# Patient Record
Sex: Female | Born: 1970 | Race: White | Hispanic: No | Marital: Married | Smoking: Never smoker
Health system: Southern US, Community
[De-identification: ages and names within clinical notes are randomized; demographics above are authoritative.]

## PROBLEM LIST (undated history)

## (undated) DIAGNOSIS — M1990 Unspecified osteoarthritis, unspecified site: Secondary | ICD-10-CM

## (undated) HISTORY — DX: Unspecified osteoarthritis, unspecified site: M19.90

---

## 2004-05-18 ENCOUNTER — Inpatient Hospital Stay: Payer: Self-pay

## 2005-01-05 ENCOUNTER — Ambulatory Visit: Payer: Self-pay

## 2006-02-13 ENCOUNTER — Ambulatory Visit: Payer: Self-pay | Admitting: Unknown Physician Specialty

## 2008-02-09 ENCOUNTER — Ambulatory Visit: Payer: Self-pay | Admitting: General Surgery

## 2008-02-20 HISTORY — PX: HERNIA REPAIR: SHX51

## 2008-03-09 ENCOUNTER — Ambulatory Visit: Payer: Self-pay | Admitting: General Surgery

## 2008-03-16 ENCOUNTER — Ambulatory Visit: Payer: Self-pay | Admitting: General Surgery

## 2009-02-19 HISTORY — PX: CARPAL TUNNEL RELEASE: SHX101

## 2011-01-29 ENCOUNTER — Emergency Department: Payer: Self-pay | Admitting: Emergency Medicine

## 2011-03-27 ENCOUNTER — Ambulatory Visit: Payer: Self-pay | Admitting: Unknown Physician Specialty

## 2011-06-27 ENCOUNTER — Ambulatory Visit: Payer: Self-pay | Admitting: Orthopedic Surgery

## 2011-10-26 ENCOUNTER — Ambulatory Visit: Payer: Self-pay | Admitting: Family Medicine

## 2013-03-24 DIAGNOSIS — M25569 Pain in unspecified knee: Secondary | ICD-10-CM | POA: Insufficient documentation

## 2015-05-30 DIAGNOSIS — H40003 Preglaucoma, unspecified, bilateral: Secondary | ICD-10-CM | POA: Diagnosis not present

## 2015-07-11 DIAGNOSIS — H40003 Preglaucoma, unspecified, bilateral: Secondary | ICD-10-CM | POA: Diagnosis not present

## 2015-11-04 DIAGNOSIS — M25561 Pain in right knee: Secondary | ICD-10-CM | POA: Diagnosis not present

## 2015-11-04 DIAGNOSIS — M170 Bilateral primary osteoarthritis of knee: Secondary | ICD-10-CM | POA: Diagnosis not present

## 2015-11-04 DIAGNOSIS — M25562 Pain in left knee: Secondary | ICD-10-CM | POA: Diagnosis not present

## 2015-11-10 ENCOUNTER — Encounter: Payer: Self-pay | Admitting: Podiatry

## 2015-11-10 ENCOUNTER — Ambulatory Visit (INDEPENDENT_AMBULATORY_CARE_PROVIDER_SITE_OTHER): Payer: BLUE CROSS/BLUE SHIELD | Admitting: Podiatry

## 2015-11-10 ENCOUNTER — Ambulatory Visit (INDEPENDENT_AMBULATORY_CARE_PROVIDER_SITE_OTHER): Payer: BLUE CROSS/BLUE SHIELD

## 2015-11-10 DIAGNOSIS — M7661 Achilles tendinitis, right leg: Secondary | ICD-10-CM | POA: Diagnosis not present

## 2015-11-10 DIAGNOSIS — R52 Pain, unspecified: Secondary | ICD-10-CM

## 2015-11-10 DIAGNOSIS — M722 Plantar fascial fibromatosis: Secondary | ICD-10-CM | POA: Diagnosis not present

## 2015-11-10 DIAGNOSIS — M7731 Calcaneal spur, right foot: Secondary | ICD-10-CM | POA: Diagnosis not present

## 2015-11-10 MED ORDER — DICLOFENAC SODIUM 75 MG PO TBEC: 75.0000 mg | DELAYED_RELEASE_TABLET | Freq: Two times a day (BID) | ORAL | 2 refills | Status: DC

## 2015-11-10 MED ORDER — METHYLPREDNISOLONE 4 MG PO TBPK: ORAL_TABLET | ORAL | 0 refills | Status: DC

## 2015-11-10 NOTE — Progress Notes (Signed)
   Subjective:    Patient ID: Stacey Castillo, female    DOB: 03-11-1970, 45 y.o.   MRN: [id number]  HPI   45 year old female presents the office today for concerns her right foot pain which is been ongoing for several months. She states that she has pain on the back of her right heel and she points on the face tendon were she to stop. She describes as a throbbing pain. She denies any numbness or tingling. She describes as an aching sensation as well. She has pain when she first gets after being on her feet all day at work as she works night shift at [employer]. No other complaints at this time. No recent injury or trauma.    Review of Systems  All other systems reviewed and are negative.      Objective:   Physical Exam General: AAO x3, NAD  Dermatological: Skin is warm, dry and supple bilateral. Nails x 10 are well manicured; remaining integument appears unremarkable at this time. There are no open sores, no preulcerative lesions, no rash or signs of infection present.  Vascular: Dorsalis Pedis artery and Posterior Tibial artery pedal pulses are 2/4 bilateral with immedate capillary fill time. There is no pain with calf compression, swelling, warmth, erythema.   Neruologic: Grossly intact via light touch bilateral. Vibratory intact via tuning fork bilateral. Protective threshold with Semmes Wienstein monofilament intact to all pedal sites bilateral.    Musculoskeletal: Tenderness to palpation along the plantar medial tubercle of the calcaneus at the insertion of plantar fascia on the right foot. There is no pain along the course of the plantar fascia within the arch of the foot. Plantar fascia appears to be intact. There is no pain with lateral compression of the calcaneus or pain with vibratory sensation. There is mild pain along the course and insertion of the achilles tendon. Thompson test is negative. No other areas of tenderness to bilateral lower extremities. MMT 5/5, ROM WNL.    Gait:  Unassisted, Nonantalgic.      Assessment & Plan:  45 year old female right posterior and inferior heel pain due to heel spurs, Achilles tendinitis and plantar fasciitis -Treatment options discussed including all alternatives, risks, and complications -Etiology of symptoms were discussed -X-rays were obtained and reviewed with the patient. He'll spurs are present. 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 No evidence of acute fracture. -She declined steroid injection today.  -Plantar fascial brace dispensed -Night splint -Prescribed voltaren as well as a Medrol Dosepak. She can start Medrol Dosepak once this is complete she can start voltaren. Discussed side effects of the medication and directed to stop if any are to occur and call the office.  -Stretching exercises discussed -Discussed shoe gear modifications and orthotics -Discussed immobilization in a cam boot given her pain however she cannot do this at work. -Follow-up as scheduled or sooner if any problems arise. In the meantime, encouraged to call the office with any questions, concerns, change in symptoms.   Ovid CurdMatthew Kallan Bischoff, DPM

## 2015-11-10 NOTE — Patient Instructions (Signed)
You can start with the medrol dose pack (steroid). Once this is complete you can start voltaren. Continue to ice and stretch daily Wear supportive shoes with a good arch support.  I will see you in 3 weeks. If you have any questions before then please give [place] a call. It was nice to meet you today.     Plantar Fasciitis With Rehab The plantar fascia is a fibrous, ligament-like, soft-tissue structure that spans the bottom of the foot. Plantar fasciitis, also called heel spur syndrome, is a condition that causes pain in the foot due to inflammation of the tissue. SYMPTOMS   Pain and tenderness on the underneath side of the foot.  Pain that worsens with standing or walking. CAUSES  Plantar fasciitis is caused by irritation and injury to the plantar fascia on the underneath side of the foot. Common mechanisms of injury include:  Direct trauma to bottom of the foot.  Damage to a small nerve that runs under the foot where the main fascia attaches to the heel bone.  Stress placed on the plantar fascia due to bone spurs. RISK INCREASES WITH:   Activities that place stress on the plantar fascia (running, jumping, pivoting, or cutting).  Poor strength and flexibility.  Improperly fitted shoes.  Tight calf muscles.  Flat feet.  Failure to warm-up properly before activity.  Obesity. PREVENTION  Warm up and stretch properly before activity.  Allow for adequate recovery between workouts.  Maintain physical fitness:  Strength, flexibility, and endurance.  Cardiovascular fitness.  Maintain a health body weight.  Avoid stress on the plantar fascia.  Wear properly fitted shoes, including arch supports for individuals who have flat feet. PROGNOSIS  If treated properly, then the symptoms of plantar fasciitis usually resolve without surgery. However, occasionally surgery is necessary. RELATED COMPLICATIONS   Recurrent symptoms that may result in a chronic condition.  Problems of  the lower back that are caused by compensating for the injury, such as limping.  Pain or weakness of the foot during push-off following surgery.  Chronic inflammation, scarring, and partial or complete fascia tear, occurring more often from repeated injections. TREATMENT  Treatment initially involves the use of ice and medication to help reduce pain and inflammation. The use of strengthening and stretching exercises may help reduce pain with activity, especially stretches of the Achilles tendon. These exercises may be performed at home or with a therapist. Your caregiver may recommend that you use heel cups of arch supports to help reduce stress on the plantar fascia. Occasionally, corticosteroid injections are given to reduce inflammation. If symptoms persist for greater than 6 months despite non-surgical (conservative), then surgery may be recommended.  MEDICATION   If pain medication is necessary, then nonsteroidal anti-inflammatory medications, such as aspirin and ibuprofen, or other minor pain relievers, such as acetaminophen, are often recommended.  Do not take pain medication within 7 days before surgery.  Prescription pain relievers may be given if deemed necessary by your caregiver. Use only as directed and only as much as you need.  Corticosteroid injections may be given by your caregiver. These injections should be reserved for the most serious cases, because they may only be given a certain number of times. HEAT AND COLD  Cold treatment (icing) relieves pain and reduces inflammation. Cold treatment should be applied for 10 to 15 minutes every 2 to 3 hours for inflammation and pain and immediately after any activity that aggravates your symptoms. Use ice packs or massage the area with a  piece of ice (ice massage).  Heat treatment may be used prior to performing the stretching and strengthening activities prescribed by your caregiver, physical therapist, or athletic trainer. Use a heat  pack or soak the injury in warm water. SEEK IMMEDIATE MEDICAL CARE IF:  Treatment seems to offer no benefit, or the condition worsens.  Any medications produce adverse side effects. EXERCISES RANGE OF MOTION (ROM) AND STRETCHING EXERCISES - Plantar Fasciitis (Heel Spur Syndrome) These exercises may help you when beginning to rehabilitate your injury. Your symptoms may resolve with or without further involvement from your physician, physical therapist or athletic trainer. While completing these exercises, remember:   Restoring tissue flexibility helps normal motion to return to the joints. This allows healthier, less painful movement and activity.  An effective stretch should be held for at least 30 seconds.  A stretch should never be painful. You should only feel a gentle lengthening or release in the stretched tissue. RANGE OF MOTION - Toe Extension, Flexion  Sit with your right / left leg crossed over your opposite knee.  Grasp your toes and gently pull them back toward the top of your foot. You should feel a stretch on the bottom of your toes and/or foot.  Hold this stretch for __________ seconds.  Now, gently pull your toes toward the bottom of your foot. You should feel a stretch on the top of your toes and or foot.  Hold this stretch for __________ seconds. Repeat __________ times. Complete this stretch __________ times per day.  RANGE OF MOTION - Ankle Dorsiflexion, Active Assisted  Remove shoes and sit on a chair that is preferably not on a carpeted surface.  Place right / left foot under knee. Extend your opposite leg for support.  Keeping your heel down, slide your right / left foot back toward the chair until you feel a stretch at your ankle or calf. If you do not feel a stretch, slide your bottom forward to the edge of the chair, while still keeping your heel down.  Hold this stretch for __________ seconds. Repeat __________ times. Complete this stretch __________  times per day.  STRETCH - Gastroc, Standing  Place hands on wall.  Extend right / left leg, keeping the front knee somewhat bent.  Slightly point your toes inward on your back foot.  Keeping your right / left heel on the floor and your knee straight, shift your weight toward the wall, not allowing your back to arch.  You should feel a gentle stretch in the right / left calf. Hold this position for __________ seconds. Repeat __________ times. Complete this stretch __________ times per day. STRETCH - Soleus, Standing  Place hands on wall.  Extend right / left leg, keeping the other knee somewhat bent.  Slightly point your toes inward on your back foot.  Keep your right / left heel on the floor, bend your back knee, and slightly shift your weight over the back leg so that you feel a gentle stretch deep in your back calf.  Hold this position for __________ seconds. Repeat __________ times. Complete this stretch __________ times per day. STRETCH - Gastrocsoleus, Standing  Note: This exercise can place a lot of stress on your foot and ankle. Please complete this exercise only if specifically instructed by your caregiver.   Place the ball of your right / left foot on a step, keeping your other foot firmly on the same step.  Hold on to the wall or a rail for balance.  Slowly lift your other foot, allowing your body weight to press your heel down over the edge of the step.  You should feel a stretch in your right / left calf.  Hold this position for __________ seconds.  Repeat this exercise with a slight bend in your right / left knee. Repeat __________ times. Complete this stretch __________ times per day.  STRENGTHENING EXERCISES - Plantar Fasciitis (Heel Spur Syndrome)  These exercises may help you when beginning to rehabilitate your injury. They may resolve your symptoms with or without further involvement from your physician, physical therapist or athletic trainer. While  completing these exercises, remember:   Muscles can gain both the endurance and the strength needed for everyday activities through controlled exercises.  Complete these exercises as instructed by your physician, physical therapist or athletic trainer. Progress the resistance and repetitions only as guided. STRENGTH - Towel Curls  Sit in a chair positioned on a non-carpeted surface.  Place your foot on a towel, keeping your heel on the floor.  Pull the towel toward your heel by only curling your toes. Keep your heel on the floor.  If instructed by your physician, physical therapist or athletic trainer, add ____________________ at the end of the towel. Repeat __________ times. Complete this exercise __________ times per day. STRENGTH - Ankle Inversion  Secure one end of a rubber exercise band/tubing to a fixed object (table, pole). Loop the other end around your foot just before your toes.  Place your fists between your knees. This will focus your strengthening at your ankle.  Slowly, pull your big toe up and in, making sure the band/tubing is positioned to resist the entire motion.  Hold this position for __________ seconds.  Have your muscles resist the band/tubing as it slowly pulls your foot back to the starting position. Repeat __________ times. Complete this exercises __________ times per day.    This information is not intended to replace advice given to you by your health care provider. Make sure you discuss any questions you have with your health care provider.   Document Released: 02/05/2005 Document Revised: 06/22/2014 Document Reviewed: 05/20/2008 Elsevier Interactive Patient Education 2016 [employer]   Achilles Tendinitis With Rehab Achilles tendinitis is a disorder of the Achilles tendon. The Achilles tendon connects the large calf muscles (Gastrocnemius and Soleus) to the heel bone (calcaneus). This tendon is sometimes called the heel cord. It is important for  pushing-off and standing on your toes and is important for walking, running, or jumping. Tendinitis is often caused by overuse and repetitive microtrauma. SYMPTOMS  Pain, tenderness, swelling, warmth, and redness may occur over the Achilles tendon even at rest.  Pain with pushing off, or flexing or extending the ankle.  Pain that is worsened after or during activity. CAUSES   Overuse sometimes seen with rapid increase in exercise programs or in sports requiring running and jumping.  Poor physical conditioning (strength and flexibility or endurance).  Running sports, especially training running down hills.  Inadequate warm-up before practice or play or failure to stretch before participation.  Injury to the tendon. PREVENTION   Warm up and stretch before practice or competition.  Allow time for adequate rest and recovery between practices and competition.  Keep up conditioning.  Keep up ankle and leg flexibility.  Improve or keep muscle strength and endurance.  Improve cardiovascular fitness.  Use proper technique.  Use proper equipment (shoes, skates).  To help prevent recurrence, taping, protective strapping, or an adhesive bandage may be  recommended for several weeks after healing is complete. PROGNOSIS   Recovery may take weeks to several months to heal.  Longer recovery is expected if symptoms have been prolonged.  Recovery is usually quicker if the inflammation is due to a direct blow as compared with overuse or sudden strain. RELATED COMPLICATIONS   Healing time will be prolonged if the condition is not correctly treated. The injury must be given plenty of time to heal.  Symptoms can reoccur if activity is resumed too soon.  Untreated, tendinitis may increase the risk of tendon rupture requiring additional time for recovery and possibly surgery. TREATMENT   The first treatment consists of rest anti-inflammatory medication, and ice to relieve the  pain.  Stretching and strengthening exercises after resolution of pain will likely help reduce the risk of recurrence. Referral to a physical therapist or athletic trainer for further evaluation and treatment may be helpful.  A walking boot or cast may be recommended to rest the Achilles tendon. This can help break the cycle of inflammation and microtrauma.  Arch supports (orthotics) may be prescribed or recommended by your caregiver as an adjunct to therapy and rest.  Surgery to remove the inflamed tendon lining or degenerated tendon tissue is rarely necessary and has shown less than predictable results. MEDICATION   Nonsteroidal anti-inflammatory medications, such as aspirin and ibuprofen, may be used for pain and inflammation relief. Do not take within 7 days before surgery. Take these as directed by your caregiver. Contact your caregiver immediately if any bleeding, stomach upset, or signs of allergic reaction occur. Other minor pain relievers, such as acetaminophen, may also be used.  Pain relievers may be prescribed as necessary by your caregiver. Do not take prescription pain medication for longer than 4 to 7 days. Use only as directed and only as much as you need.  Cortisone injections are rarely indicated. Cortisone injections may weaken tendons and predispose to rupture. It is better to give the condition more time to heal than to use them. HEAT AND COLD  Cold is used to relieve pain and reduce inflammation for acute and chronic Achilles tendinitis. Cold should be applied for 10 to 15 minutes every 2 to 3 hours for inflammation and pain and immediately after any activity that aggravates your symptoms. Use ice packs or an ice massage.  Heat may be used before performing stretching and strengthening activities prescribed by your caregiver. Use a heat pack or a warm soak. SEEK MEDICAL CARE IF:  Symptoms get worse or do not improve in 2 weeks despite treatment.  New, unexplained  symptoms develop. Drugs used in treatment may produce side effects. EXERCISES RANGE OF MOTION (ROM) AND STRETCHING EXERCISES - Achilles Tendinitis  These exercises may help you when beginning to rehabilitate your injury. Your symptoms may resolve with or without further involvement from your physician, physical therapist or athletic trainer. While completing these exercises, remember:   Restoring tissue flexibility helps normal motion to return to the joints. This allows healthier, less painful movement and activity.  An effective stretch should be held for at least 30 seconds.  A stretch should never be painful. You should only feel a gentle lengthening or release in the stretched tissue. STRETCH - Gastroc, Standing   Place hands on wall.  Extend right / left leg, keeping the front knee somewhat bent.  Slightly point your toes inward on your back foot.  Keeping your right / left heel on the floor and your knee straight, shift your weight  toward the wall, not allowing your back to arch.  You should feel a gentle stretch in the right / left calf. Hold this position for __________ seconds. Repeat __________ times. Complete this stretch __________ times per day. STRETCH - Soleus, Standing   Place hands on wall.  Extend right / left leg, keeping the other knee somewhat bent.  Slightly point your toes inward on your back foot.  Keep your right / left heel on the floor, bend your back knee, and slightly shift your weight over the back leg so that you feel a gentle stretch deep in your back calf.  Hold this position for __________ seconds. Repeat __________ times. Complete this stretch __________ times per day. STRETCH - Gastrocsoleus, Standing  Note: This exercise can place a lot of stress on your foot and ankle. Please complete this exercise only if specifically instructed by your caregiver.   Place the ball of your right / left foot on a step, keeping your other foot firmly on the  same step.  Hold on to the wall or a rail for balance.  Slowly lift your other foot, allowing your body weight to press your heel down over the edge of the step.  You should feel a stretch in your right / left calf.  Hold this position for __________ seconds.  Repeat this exercise with a slight bend in your knee. Repeat __________ times. Complete this stretch __________ times per day.  STRENGTHENING EXERCISES - Achilles Tendinitis These exercises may help you when beginning to rehabilitate your injury. They may resolve your symptoms with or without further involvement from your physician, physical therapist or athletic trainer. While completing these exercises, remember:   Muscles can gain both the endurance and the strength needed for everyday activities through controlled exercises.  Complete these exercises as instructed by your physician, physical therapist or athletic trainer. Progress the resistance and repetitions only as guided.  You may experience muscle soreness or fatigue, but the pain or discomfort you are trying to eliminate should never worsen during these exercises. If this pain does worsen, stop and make certain you are following the directions exactly. If the pain is still present after adjustments, discontinue the exercise until you can discuss the trouble with your clinician. STRENGTH - Plantar-flexors   Sit with your right / left leg extended. Holding onto both ends of a rubber exercise band/tubing, loop it around the ball of your foot. Keep a slight tension in the band.  Slowly push your toes away from you, pointing them downward.  Hold this position for __________ seconds. Return slowly, controlling the tension in the band/tubing. Repeat __________ times. Complete this exercise __________ times per day.  STRENGTH - Plantar-flexors   Stand with your feet shoulder width apart. Steady yourself with a wall or table using as little support as needed.  Keeping your  weight evenly spread over the width of your feet, rise up on your toes.*  Hold this position for __________ seconds. Repeat __________ times. Complete this exercise __________ times per day.  *If this is too easy, shift your weight toward your right / left leg until you feel challenged. Ultimately, you may be asked to do this exercise with your right / left foot only. STRENGTH - Plantar-flexors, Eccentric  Note: This exercise can place a lot of stress on your foot and ankle. Please complete this exercise only if specifically instructed by your caregiver.   Place the balls of your feet on a step. With your hands,  use only enough support from a wall or rail to keep your balance.  Keep your knees straight and rise up on your toes.  Slowly shift your weight entirely to your right / left toes and pick up your opposite foot. Gently and with controlled movement, lower your weight through your right / left foot so that your heel drops below the level of the step. You will feel a slight stretch in the back of your calf at the end position.  Use the healthy leg to help rise up onto the balls of both feet, then lower weight only on the right / left leg again. Build up to 15 repetitions. Then progress to 3 consecutive sets of 15 repetitions.*  After completing the above exercise, complete the same exercise with a slight knee bend (about 30 degrees). Again, build up to 15 repetitions. Then progress to 3 consecutive sets of 15 repetitions.* Perform this exercise __________ times per day.  *When you easily complete 3 sets of 15, your physician, physical therapist or athletic trainer may advise you to add resistance by wearing a backpack filled with additional weight. STRENGTH - Plantar Flexors, Seated   Sit on a chair that allows your feet to rest flat on the ground. If necessary, sit at the edge of the chair.  Keeping your toes firmly on the ground, lift your right / left heel as far as you can without  increasing any discomfort in your ankle. Repeat __________ times. Complete this exercise __________ times a day. *If instructed by your physician, physical therapist or athletic trainer, you may add ____________________ of resistance by placing a weighted object on your right / left knee.   This information is not intended to replace advice given to you by your health care provider. Make sure you discuss any questions you have with your health care provider.   Document Released: 09/06/2004 Document Revised: 02/26/2014 Document Reviewed: 05/20/2008 [employer] Interactive Patient Education [employer]

## 2015-11-15 DIAGNOSIS — M722 Plantar fascial fibromatosis: Secondary | ICD-10-CM | POA: Insufficient documentation

## 2015-11-15 DIAGNOSIS — M7730 Calcaneal spur, unspecified foot: Secondary | ICD-10-CM | POA: Insufficient documentation

## 2015-11-15 DIAGNOSIS — M7661 Achilles tendinitis, right leg: Secondary | ICD-10-CM | POA: Insufficient documentation

## 2015-12-01 ENCOUNTER — Encounter: Payer: Self-pay | Admitting: Podiatry

## 2015-12-01 ENCOUNTER — Ambulatory Visit (INDEPENDENT_AMBULATORY_CARE_PROVIDER_SITE_OTHER): Payer: BLUE CROSS/BLUE SHIELD | Admitting: Podiatry

## 2015-12-01 DIAGNOSIS — M7661 Achilles tendinitis, right leg: Secondary | ICD-10-CM

## 2015-12-01 DIAGNOSIS — M722 Plantar fascial fibromatosis: Secondary | ICD-10-CM

## 2015-12-01 NOTE — Progress Notes (Signed)
Subjective: 45 year old female presents the office today for follow-up evaluation of right fasciitis and Achilles tendinitis. She states that since starting the steroids and anti-inflammatories she is also been stretching icing and her symptoms have greatly improved. She does that she has made very substantial improvements in her symptoms compared to last appointment she has only very minimal pain intermittently. She is also been using night splint intermittently.Denies any systemic complaints such as fevers, chills, nausea, vomiting. No acute changes since last appointment, and no other complaints at this time.   Objective: AAO x3, NAD DP/PT pulses palpable bilaterally, CRT less than 3 seconds There is minimal tenderness palpation along the plantar medial tubercle of the calcaneus at insertion on her fascia. Is no pain within the arch of the foot. No pain on the Achilles tendon or along the insertion. Achilles tendon appears to be intact. No pain with lateral compression of the calcaneus. No other areas of tenderness bilaterally. Equinus is present. Decrease in medial arch height. No open lesions or pre-ulcerative lesions.  No pain with calf compression, swelling, warmth, erythema  Assessment: Resolving plantar fasciitis, Achilles tendinitis  Plan: -All treatment options discussed with the patient including all alternatives, risks, complications.  -Again today she declines a steroid injection. She states that she is doing much better and she wishes to hold off. -Continue stretching, icing exercises daily. Also continue with night splint as well as supportive shoe gear. She was [facility]. Follow up in 3-4 weeks to pick up inserts or sooner if needed.- -Patient encouraged to call the office with any questions, concerns, change in symptoms.   Ovid CurdMatthew Wagoner, DPM

## 2015-12-29 ENCOUNTER — Encounter: Payer: Self-pay | Admitting: Podiatry

## 2015-12-29 ENCOUNTER — Ambulatory Visit (INDEPENDENT_AMBULATORY_CARE_PROVIDER_SITE_OTHER): Payer: BLUE CROSS/BLUE SHIELD | Admitting: Podiatry

## 2015-12-29 DIAGNOSIS — M722 Plantar fascial fibromatosis: Secondary | ICD-10-CM

## 2015-12-29 DIAGNOSIS — M7661 Achilles tendinitis, right leg: Secondary | ICD-10-CM

## 2015-12-29 NOTE — Patient Instructions (Signed)

## 2015-12-29 NOTE — Progress Notes (Signed)
Patient presents the pickup orthotics. Break in instructions were discussed. She was seen by Hadley PenLisa Cox, CMA. She states she is doing good and declined appointment by myself. Follow-up in 4 weeks.

## 2016-01-26 ENCOUNTER — Ambulatory Visit (INDEPENDENT_AMBULATORY_CARE_PROVIDER_SITE_OTHER): Payer: BLUE CROSS/BLUE SHIELD | Admitting: Podiatry

## 2016-01-26 ENCOUNTER — Encounter: Payer: Self-pay | Admitting: Podiatry

## 2016-01-26 DIAGNOSIS — M7661 Achilles tendinitis, right leg: Secondary | ICD-10-CM

## 2016-01-26 NOTE — Progress Notes (Signed)
Subjective: 45 year old female presents the office today for follow up evaluation of Achilles tendinitis in the right foot. She states that she has difficulty wearing the night splint. She's been trying the orthotics. She feels on the left heel is not fitting correctly. She has a states the orthotics are making her knees hurt. She has arthritis in her knees as well. She states that she starting at the same pain in the Achilles tendon on her left foot as well. No recent injury or trauma. She does continue to stretch as much as possible. Denies any systemic complaints such as fevers, chills, nausea, vomiting. No acute changes since last appointment, and no other complaints at this time.   Objective: AAO x3, NAD DP/PT pulses palpable bilaterally, CRT less than 3 seconds There is tenderness to palpation to the posterior aspect of the Achilles tendon on the insertion into the calcaneus. Janee Mornhompson does is negative. There is no edema, erythema, increase in warmth. Equinus is present. There is no pain with lateral compression of the calcaneus there is no pain along the plantar fascia or to other areas of the foot. No open lesions or pre-ulcerative lesions.  No pain with calf compression, swelling, warmth, erythema  Assessment: Bilateral Achilles tendinitis  Plan: -All treatment options discussed with the patient including all alternatives, risks, complications.  -Continue stretching at home as well as ice. At this point we'll also start physical therapy. Prescription is provided today for [facility]. -Topical antifungal detail ordered today through [facility] -Ice and the orthotics back for modifications to lower the arch and a softer cushion. -Follow-up after physical therapy or sooner if needed. -Patient encouraged to call the office with any questions, concerns, change in symptoms.   Ovid CurdMatthew Wagoner, DPM

## 2016-02-01 DIAGNOSIS — M7662 Achilles tendinitis, left leg: Secondary | ICD-10-CM | POA: Diagnosis not present

## 2016-02-01 DIAGNOSIS — M7661 Achilles tendinitis, right leg: Secondary | ICD-10-CM | POA: Diagnosis not present

## 2016-02-03 DIAGNOSIS — M7661 Achilles tendinitis, right leg: Secondary | ICD-10-CM | POA: Diagnosis not present

## 2016-02-03 DIAGNOSIS — M7662 Achilles tendinitis, left leg: Secondary | ICD-10-CM | POA: Diagnosis not present

## 2016-02-06 DIAGNOSIS — M7661 Achilles tendinitis, right leg: Secondary | ICD-10-CM | POA: Diagnosis not present

## 2016-02-06 DIAGNOSIS — M7662 Achilles tendinitis, left leg: Secondary | ICD-10-CM | POA: Diagnosis not present

## 2016-02-08 DIAGNOSIS — M7662 Achilles tendinitis, left leg: Secondary | ICD-10-CM | POA: Diagnosis not present

## 2016-02-08 DIAGNOSIS — M7661 Achilles tendinitis, right leg: Secondary | ICD-10-CM | POA: Diagnosis not present

## 2016-02-10 DIAGNOSIS — M7662 Achilles tendinitis, left leg: Secondary | ICD-10-CM | POA: Diagnosis not present

## 2016-02-10 DIAGNOSIS — M7661 Achilles tendinitis, right leg: Secondary | ICD-10-CM | POA: Diagnosis not present

## 2016-02-15 DIAGNOSIS — M7661 Achilles tendinitis, right leg: Secondary | ICD-10-CM | POA: Diagnosis not present

## 2016-02-15 DIAGNOSIS — M7662 Achilles tendinitis, left leg: Secondary | ICD-10-CM | POA: Diagnosis not present

## 2016-02-17 DIAGNOSIS — M7662 Achilles tendinitis, left leg: Secondary | ICD-10-CM | POA: Diagnosis not present

## 2016-02-17 DIAGNOSIS — M7661 Achilles tendinitis, right leg: Secondary | ICD-10-CM | POA: Diagnosis not present

## 2016-02-22 DIAGNOSIS — M7662 Achilles tendinitis, left leg: Secondary | ICD-10-CM | POA: Diagnosis not present

## 2016-02-22 DIAGNOSIS — M7661 Achilles tendinitis, right leg: Secondary | ICD-10-CM | POA: Diagnosis not present

## 2016-02-24 DIAGNOSIS — M7661 Achilles tendinitis, right leg: Secondary | ICD-10-CM | POA: Diagnosis not present

## 2016-02-24 DIAGNOSIS — M7662 Achilles tendinitis, left leg: Secondary | ICD-10-CM | POA: Diagnosis not present

## 2016-02-29 DIAGNOSIS — M7662 Achilles tendinitis, left leg: Secondary | ICD-10-CM | POA: Diagnosis not present

## 2016-02-29 DIAGNOSIS — M7661 Achilles tendinitis, right leg: Secondary | ICD-10-CM | POA: Diagnosis not present

## 2016-03-02 DIAGNOSIS — M7662 Achilles tendinitis, left leg: Secondary | ICD-10-CM | POA: Diagnosis not present

## 2016-03-02 DIAGNOSIS — M7661 Achilles tendinitis, right leg: Secondary | ICD-10-CM | POA: Diagnosis not present

## 2016-03-07 DIAGNOSIS — M7661 Achilles tendinitis, right leg: Secondary | ICD-10-CM | POA: Diagnosis not present

## 2016-03-07 DIAGNOSIS — M7662 Achilles tendinitis, left leg: Secondary | ICD-10-CM | POA: Diagnosis not present

## 2016-03-09 DIAGNOSIS — M7662 Achilles tendinitis, left leg: Secondary | ICD-10-CM | POA: Diagnosis not present

## 2016-03-09 DIAGNOSIS — M7661 Achilles tendinitis, right leg: Secondary | ICD-10-CM | POA: Diagnosis not present

## 2016-03-14 DIAGNOSIS — M7661 Achilles tendinitis, right leg: Secondary | ICD-10-CM | POA: Diagnosis not present

## 2016-03-14 DIAGNOSIS — M7662 Achilles tendinitis, left leg: Secondary | ICD-10-CM | POA: Diagnosis not present

## 2016-03-16 DIAGNOSIS — M7661 Achilles tendinitis, right leg: Secondary | ICD-10-CM | POA: Diagnosis not present

## 2016-03-16 DIAGNOSIS — M7662 Achilles tendinitis, left leg: Secondary | ICD-10-CM | POA: Diagnosis not present

## 2016-03-21 DIAGNOSIS — M7661 Achilles tendinitis, right leg: Secondary | ICD-10-CM | POA: Diagnosis not present

## 2016-03-21 DIAGNOSIS — M7662 Achilles tendinitis, left leg: Secondary | ICD-10-CM | POA: Diagnosis not present

## 2016-03-23 DIAGNOSIS — M7661 Achilles tendinitis, right leg: Secondary | ICD-10-CM | POA: Diagnosis not present

## 2016-03-23 DIAGNOSIS — M7662 Achilles tendinitis, left leg: Secondary | ICD-10-CM | POA: Diagnosis not present

## 2016-03-28 DIAGNOSIS — M7662 Achilles tendinitis, left leg: Secondary | ICD-10-CM | POA: Diagnosis not present

## 2016-03-28 DIAGNOSIS — M7661 Achilles tendinitis, right leg: Secondary | ICD-10-CM | POA: Diagnosis not present

## 2016-03-29 DIAGNOSIS — M7662 Achilles tendinitis, left leg: Secondary | ICD-10-CM | POA: Diagnosis not present

## 2016-03-29 DIAGNOSIS — M7661 Achilles tendinitis, right leg: Secondary | ICD-10-CM | POA: Diagnosis not present

## 2016-03-30 ENCOUNTER — Ambulatory Visit (INDEPENDENT_AMBULATORY_CARE_PROVIDER_SITE_OTHER): Payer: BLUE CROSS/BLUE SHIELD | Admitting: Podiatry

## 2016-03-30 ENCOUNTER — Encounter: Payer: Self-pay | Admitting: Podiatry

## 2016-03-30 VITALS — BP 160/98 | HR 85

## 2016-03-30 DIAGNOSIS — M79671 Pain in right foot: Secondary | ICD-10-CM | POA: Diagnosis not present

## 2016-03-30 DIAGNOSIS — M7661 Achilles tendinitis, right leg: Secondary | ICD-10-CM | POA: Diagnosis not present

## 2016-03-30 DIAGNOSIS — M722 Plantar fascial fibromatosis: Secondary | ICD-10-CM | POA: Diagnosis not present

## 2016-03-30 DIAGNOSIS — M7662 Achilles tendinitis, left leg: Secondary | ICD-10-CM | POA: Diagnosis not present

## 2016-04-06 DIAGNOSIS — M7662 Achilles tendinitis, left leg: Secondary | ICD-10-CM | POA: Diagnosis not present

## 2016-04-06 DIAGNOSIS — M7661 Achilles tendinitis, right leg: Secondary | ICD-10-CM | POA: Diagnosis not present

## 2016-04-11 DIAGNOSIS — M7662 Achilles tendinitis, left leg: Secondary | ICD-10-CM | POA: Diagnosis not present

## 2016-04-11 DIAGNOSIS — M7661 Achilles tendinitis, right leg: Secondary | ICD-10-CM | POA: Diagnosis not present

## 2016-04-14 NOTE — Progress Notes (Signed)
   Subjective:  46 year old female presents today for follow-up evaluation of bilateral Achilles tendinitis. Patient was last seen 01/26/2016 under the care of Dr. Ardelle AntonWagoner. Patient states that she did receive injections in the past and they do not help alleviate symptoms. She is not currently interested in anti-inflammatory injections. Patient states that the right Achilles tendon is worse than the left. Patient denies trauma. No alleviating factors other than rest.    Objective/Physical Exam General: The patient is alert and oriented x3 in no acute distress.  Dermatology: Skin is warm, dry and supple bilateral lower extremities. Negative for open lesions or macerations.  Vascular: Palpable pedal pulses bilaterally. No edema or erythema noted. Capillary refill within normal limits.  Neurological: Epicritic and protective threshold grossly intact bilaterally.   Musculoskeletal Exam: Pain on palpation noted to the bilateral lower extremities at the insertion of the Achilles tendon into the posterior tubercle of the calcaneus consistent with an insertional Achilles tendinitis. Pain on palpation also noted to the medial calcaneal tubercle of the insertion of the plantar fascia right foot.   Assessment: #1 insertional Achilles tendinitis bilateral #2 plantar fasciitis right foot   Plan of Care:  #1 Patient was evaluated. #2 prescription for anti-inflammatory pain cream [facility] #3 recommend light compression stockings of the knee #4 return to clinic in 4 weeks #5 patient has artery tried multiple conservative modalities including stretching, physical therapy, orthotics, anti-inflammatory injections under the care of Dr. Ardelle AntonWagoner. #6 return to clinic in 4 weeks  Patient works at [employer] and is on her feet for 12 hour shifts   Felecia ShellingBrent M. Evans, DPM [facility]  Dr. Felecia ShellingBrent M. Evans, DPM    [address]                                          [place], [place] [address]                Office [phone]  Fax [phone]

## 2016-04-18 DIAGNOSIS — M7662 Achilles tendinitis, left leg: Secondary | ICD-10-CM | POA: Diagnosis not present

## 2016-04-18 DIAGNOSIS — M7661 Achilles tendinitis, right leg: Secondary | ICD-10-CM | POA: Diagnosis not present

## 2016-04-20 DIAGNOSIS — M7661 Achilles tendinitis, right leg: Secondary | ICD-10-CM | POA: Diagnosis not present

## 2016-04-20 DIAGNOSIS — M7662 Achilles tendinitis, left leg: Secondary | ICD-10-CM | POA: Diagnosis not present

## 2016-04-23 DIAGNOSIS — M7662 Achilles tendinitis, left leg: Secondary | ICD-10-CM | POA: Diagnosis not present

## 2016-04-23 DIAGNOSIS — M7661 Achilles tendinitis, right leg: Secondary | ICD-10-CM | POA: Diagnosis not present

## 2016-04-25 DIAGNOSIS — M7662 Achilles tendinitis, left leg: Secondary | ICD-10-CM | POA: Diagnosis not present

## 2016-04-25 DIAGNOSIS — M7661 Achilles tendinitis, right leg: Secondary | ICD-10-CM | POA: Diagnosis not present

## 2016-04-30 DIAGNOSIS — M7661 Achilles tendinitis, right leg: Secondary | ICD-10-CM | POA: Diagnosis not present

## 2016-04-30 DIAGNOSIS — M7662 Achilles tendinitis, left leg: Secondary | ICD-10-CM | POA: Diagnosis not present

## 2016-05-02 DIAGNOSIS — M7662 Achilles tendinitis, left leg: Secondary | ICD-10-CM | POA: Diagnosis not present

## 2016-05-02 DIAGNOSIS — M7661 Achilles tendinitis, right leg: Secondary | ICD-10-CM | POA: Diagnosis not present

## 2016-05-07 DIAGNOSIS — M7662 Achilles tendinitis, left leg: Secondary | ICD-10-CM | POA: Diagnosis not present

## 2016-05-07 DIAGNOSIS — M7661 Achilles tendinitis, right leg: Secondary | ICD-10-CM | POA: Diagnosis not present

## 2016-05-16 DIAGNOSIS — M7661 Achilles tendinitis, right leg: Secondary | ICD-10-CM | POA: Diagnosis not present

## 2016-05-16 DIAGNOSIS — M7662 Achilles tendinitis, left leg: Secondary | ICD-10-CM | POA: Diagnosis not present

## 2016-05-21 DIAGNOSIS — M7662 Achilles tendinitis, left leg: Secondary | ICD-10-CM | POA: Diagnosis not present

## 2016-05-21 DIAGNOSIS — M7661 Achilles tendinitis, right leg: Secondary | ICD-10-CM | POA: Diagnosis not present

## 2016-05-28 ENCOUNTER — Encounter: Payer: Self-pay | Admitting: Family Medicine

## 2016-05-28 ENCOUNTER — Ambulatory Visit (INDEPENDENT_AMBULATORY_CARE_PROVIDER_SITE_OTHER): Payer: BLUE CROSS/BLUE SHIELD | Admitting: Family Medicine

## 2016-05-28 VITALS — BP 146/88 | HR 82 | Temp 98.3°F | Ht 66.0 in | Wt 313.0 lb

## 2016-05-28 DIAGNOSIS — R030 Elevated blood-pressure reading, without diagnosis of hypertension: Secondary | ICD-10-CM | POA: Diagnosis not present

## 2016-05-28 DIAGNOSIS — Z23 Encounter for immunization: Secondary | ICD-10-CM | POA: Diagnosis not present

## 2016-05-28 DIAGNOSIS — Z0000 Encounter for general adult medical examination without abnormal findings: Secondary | ICD-10-CM | POA: Diagnosis not present

## 2016-05-28 DIAGNOSIS — M7662 Achilles tendinitis, left leg: Secondary | ICD-10-CM | POA: Diagnosis not present

## 2016-05-28 DIAGNOSIS — E6609 Other obesity due to excess calories: Secondary | ICD-10-CM | POA: Diagnosis not present

## 2016-05-28 DIAGNOSIS — M7661 Achilles tendinitis, right leg: Secondary | ICD-10-CM | POA: Diagnosis not present

## 2016-05-28 DIAGNOSIS — IMO0001 Reserved for inherently not codable concepts without codable children: Secondary | ICD-10-CM

## 2016-05-28 DIAGNOSIS — Z6841 Body Mass Index (BMI) 40.0 and over, adult: Secondary | ICD-10-CM

## 2016-05-28 DIAGNOSIS — M170 Bilateral primary osteoarthritis of knee: Secondary | ICD-10-CM | POA: Diagnosis not present

## 2016-05-28 DIAGNOSIS — Z114 Encounter for screening for human immunodeficiency virus [HIV]: Secondary | ICD-10-CM | POA: Diagnosis not present

## 2016-05-28 NOTE — Patient Instructions (Signed)
Fad Diets A fad diet is a diet intended for fast weight loss. Popular fad diets include:  Low- and no-carbohydrate diets.  Liquid formula diets.  Very low-calorie diets.  Special food combination diets, such as the raw food diet. Fad diets usually do not lead to permanent weight loss. How do I recognize a fad diet? If the information about a way of eating promises results that sound too good to be true, it is probably a fad diet. Fad diets often:  Promote "magic" or "miracle" foods.  Guarantee a quick fix to your weight problems.  List "good foods" and "bad foods."  Have rigid menus and a strict calorie restriction.  Require taking pills, herbs, or powders.  Require you to buy a particular product.  Require you to skip meals or to replace meals with a special drink or food bar.  Require specific food combinations.  Require you to cut out an entire food group, such as carbohydrates or fat.  Require you to eat large quantities of one or more foods.  Do not include a health warning.  Do not require you to increase your physical activity.  Make dramatic claims that are refuted by reputable scientific organizations. What are the dangers of fad diets? There is no scientific evidence that fad diets work, and some may do more harm than good. Dangers of fad diets include:  Weight gain after stopping the diet. Most fad diets are too impractical to follow for the long term. People eventually stop dieting and go back to their usual eating patterns. This creates a yo-yo effect of weight loss and weight gain that is hard on the body and mind.  Nutrient deficiency. If the diet restricts certain types of food, there is a risk of becoming deficient in certain vitamins and minerals.  Problems associated with inactivity. Many fad diets do not encourage physical activity. Physical activity is key to maintaining long-term weight loss. Being inactive is also a major risk factor for heart  disease, stroke, and diabetes. How can I lose weight without a fad diet?   A healthy way to lose weight and maintain a healthy weight is to:  Eat fewer calories.  Choose healthy foods, such as vegetables, whole grains, fruits, lean proteins, and healthy fats.  Balance your overall food intake with physical activity. All foods, in moderation, can be a part of your eating plan while you work to achieve a healthy weight. Making lifestyle changes to your eating and physical activity habits will allow you to work toward lasting change. This information is not intended to replace advice given to you by your health care provider. Make sure you discuss any questions you have with your health care provider. Document Released: 11/20/2013 Document Revised: 08/26/2015 Document Reviewed: 07/21/2013 Elsevier Interactive Patient Education  2017 [employer].

## 2016-05-28 NOTE — Progress Notes (Signed)
Patient: Stacey Castillo, Female    DOB: 27-Mar-1970, 46 y.o.   MRN: [id number] Visit Date: 05/28/2016  Today's Provider: Dortha Kern, PA   Chief Complaint  Patient presents with  . Establish Care  . Annual Exam   Subjective:  .new  Annual physical exam Stacey Castillo is a 46 y.o. female who presents today for health maintenance and complete physical. She feels fairly well. She reports exercising none due to knee injuries. She reports she is sleeping fairly well. Patient works 3rd shift.  -----------------------------------------------------------------   Review of Systems  Constitutional: Positive for diaphoresis.  HENT: Negative.   Eyes: Negative.   Respiratory: Negative.   Cardiovascular: Positive for leg swelling.  Gastrointestinal: Negative.   Endocrine: Negative.   Genitourinary: Negative.   Musculoskeletal: Positive for arthralgias, joint swelling and myalgias.  Skin: Positive for rash.  Allergic/Immunologic: Negative.   Neurological: Negative.   Hematological: Negative.   Psychiatric/Behavioral: Negative.     Social History      She  reports that she has never smoked. She has never used smokeless tobacco. She reports that she drinks alcohol. She reports that she does not use drugs.       Social History   Social History  . Marital status: Married    Spouse name: Gene  . Number of children: 2 boys and 1 girl  . Years of education: 1-1.5 years of college   Social History Main Topics  . Smoking status: Never Smoker  . Smokeless tobacco: Never Used  . Alcohol use Yes     Comment: occasionally   . Drug use: No  . Sexual activity: Not Asked   Other Topics Concern  . None   Social History Narrative  . None    Past Medical History:  Diagnosis Date  . Arthritis      Patient Active Problem List   Diagnosis Date Noted  . Heel spur 11/15/2015  . Plantar fasciitis 11/15/2015  . Achilles tendinitis of right lower extremity 11/15/2015  . Knee pain  03/24/2013    Past Surgical History:  Procedure Laterality Date  . CARPAL TUNNEL RELEASE Bilateral 2011  . CESAREAN SECTION    . HERNIA REPAIR  2010    Family History        Family Status  Relation Status  . Mother Alive  . Father Deceased at age 15  . Sister Alive  . Brother Alive  . Paternal Uncle Deceased  . Maternal Grandmother Deceased  . Maternal Grandfather Deceased  . Paternal Grandmother Deceased at age early 32-60's  . Paternal Grandfather Deceased at age early 28-60's        Her family history includes Diabetes in her father; Heart disease in her father; Hypercholesterolemia in her mother; Hypertension in her maternal grandmother and mother; Kidney disease in her paternal uncle; Obesity in her brother and sister.     Allergies  Allergen Reactions  . Latex Rash, Swelling and Itching  . Penicillin G Hives and Swelling     Current Outpatient Prescriptions:  .  ibuprofen (ADVIL,MOTRIN) 200 MG tablet, Take by mouth., Disp: , Rfl:  .  NONFORMULARY OR COMPOUNDED ITEM, [facility]  Achilles Tendonitis Cream- Diclofenac 3%, Baclofen 2%, Bupivacaine 1%, Doxepin 5%, Gabapentin 6%, Ibuprofen 3%, Pentoxifylline 3% Apply 1-2 grams to affected area 3-4 times daily Qty. 120 gm 3 refills, Disp: , Rfl:  .  diclofenac (VOLTAREN) 75 MG EC tablet, Take 1 tablet (75 mg total) by mouth 2 (two)  times daily. (Patient not taking: Reported on 05/28/2016), Disp: 30 tablet, Rfl: 2   Patient Care Team: Tamsen Roers, PA as PCP - General (Family Medicine)      Objective:   Vitals: BP (!) 146/88 (BP Location: Right Arm, Patient Position: Sitting, Cuff Size: Large)   Pulse 82   Temp 98.3 F (36.8 C) (Oral)   Ht  (1.676 m)   Wt (!) 313 lb (142 kg)   LMP 05/09/2016 (Exact Date)   SpO2 98%   BMI 50.52 kg/m    Vitals:   05/28/16 0924  BP: (!) 146/88  Pulse: 82  Temp: 98.3 F (36.8 C)  TempSrc: Oral  SpO2: 98%  Weight: (!) 313 lb (142 kg)  Height:  (1.676 m)       Physical Exam  Constitutional: She is oriented to person, place, and time. She appears well-developed and well-nourished.  HENT:  Head: Normocephalic and atraumatic.  Right Ear: External ear normal.  Left Ear: External ear normal.  Nose: Nose normal.  Mouth/Throat: Oropharynx is clear and moist.  Eyes: Conjunctivae and EOM are normal. Pupils are equal, round, and reactive to light. Right eye exhibits no discharge.  Neck: Normal range of motion. Neck supple. No tracheal deviation present. No thyromegaly present.  Cardiovascular: Normal rate, regular rhythm, normal heart sounds and intact distal pulses.   No murmur heard. Pulmonary/Chest: Effort normal and breath sounds normal. No respiratory distress. She has no wheezes. She has no rales. She exhibits no tenderness.  Abdominal: Soft. She exhibits no distension and no mass. There is no tenderness. There is no rebound and no guarding.  Genitourinary:  Genitourinary Comments: Exam deferred to GYN at [facility].  Musculoskeletal: Normal range of motion. She exhibits no edema or tenderness.  Lymphadenopathy:    She has no cervical adenopathy.  Neurological: She is alert and oriented to person, place, and time. She has normal reflexes. No cranial nerve deficit. She exhibits normal muscle tone. Coordination normal.  Skin: Skin is warm and dry. No rash noted. No erythema.  Psychiatric: She has a normal mood and affect. Her behavior is normal. Judgment and thought content normal.     Depression Screen PHQ 2/9 Scores 05/28/2016  PHQ - 2 Score 0  PHQ- 9 Score 6    Assessment & Plan:     Routine Health Maintenance and Physical Exam  Exercise Activities and Dietary recommendations Goals    Recommend restricting caloric intake and water aerobics as arthritis allows..       Health Maintenance  Topic Date Due  . HIV Screening  03/15/1984  . TETANUS/TDAP  03/15/1988  . PAP SMEAR  03/15/1990  . INFLUENZA VACCINE  09/19/2016      Discussed health benefits of physical activity, and encouraged her to engage in regular exercise appropriate for her age and condition.    -------------------------------------------------------------------- 1. Annual physical exam General health stable with severe obesity and arthritis of knees and feet. Gets G[facility] exam at [facility]. Check routine labs. Irregular menses with LMP 05-09-16.  - CBC with Differential/Platelet - Comprehensive metabolic panel - Lipid panel - Hemoglobin A1c - TSH  2. Primary osteoarthritis of both knees Arthritis in both knees with history of Synvisc injections twice by Dr. Ernest Pine. Trying to lose weight so she can have the left knee joint replaced. Advil 1200 mg twice a week some help with topical use of pain cream prescribed by podiatrist for Achille's tendinitis/arthritis. Check labs and continue follow up with Orthopedist (Dr.  Hooten). - CBC with Differential/Platelet  3. Need for diphtheria-tetanus-pertussis (Tdap) vaccine Given Tdap today.  4. Class 3 obesity due to excess calories with body mass index (BMI) of 50.0 to 59.9 in adult, unspecified whether serious comorbidity present (HCC) Unable to exercise much due to severe arthritis of both knees and feet. Recommend labwork to evaluate for metabolic disorder with father having diabetes. Restrict calorie intake and recommend water aerobics since arthritis of knees does not allow much exercise. Recheck pending lab reports. - CBC with Differential/Platelet - Comprehensive metabolic panel - Lipid panel - Hemoglobin A1c - TSH  5. Elevated blood pressure reading Borderline elevation of BP today. Recommend restricting caloric intake and sodium. Check routine labs and recheck pending report. - CBC with Differential/Platelet - Comprehensive metabolic panel - Lipid panel - TSH  6. Screening for HIV without presence of risk factors - HIV antibody    Dortha Kern, PA  [facility]

## 2016-05-29 LAB — COMPREHENSIVE METABOLIC PANEL
ALBUMIN: 4.5 g/dL (ref 3.5–5.5)
ALT: 14 IU/L (ref 0–32)
AST: 14 IU/L (ref 0–40)
Albumin/Globulin Ratio: 1.8 (ref 1.2–2.2)
Alkaline Phosphatase: 65 IU/L (ref 39–117)
BUN / CREAT RATIO: 22 (ref 9–23)
BUN: 13 mg/dL (ref 6–24)
Bilirubin Total: 0.4 mg/dL (ref 0.0–1.2)
CO2: 26 mmol/L (ref 18–29)
Calcium: 9.1 mg/dL (ref 8.7–10.2)
Chloride: 102 mmol/L (ref 96–106)
Creatinine, Ser: 0.58 mg/dL (ref 0.57–1.00)
GFR calc non Af Amer: 110 mL/min/{1.73_m2} (ref >59)
GFR, EST AFRICAN AMERICAN: 127 mL/min/{1.73_m2} (ref >59)
GLOBULIN, TOTAL: 2.5 g/dL (ref 1.5–4.5)
GLUCOSE: 80 mg/dL (ref 65–99)
Potassium: 4.4 mmol/L (ref 3.5–5.2)
SODIUM: 141 mmol/L (ref 134–144)
TOTAL PROTEIN: 7 g/dL (ref 6.0–8.5)

## 2016-05-29 LAB — CBC WITH DIFFERENTIAL/PLATELET
BASOS ABS: 0 10*3/uL (ref 0.0–0.2)
Basos: 1 %
EOS (ABSOLUTE): 0.2 10*3/uL (ref 0.0–0.4)
EOS: 2 %
HEMATOCRIT: 37 % (ref 34.0–46.6)
HEMOGLOBIN: 11.9 g/dL (ref 11.1–15.9)
IMMATURE GRANS (ABS): 0 10*3/uL (ref 0.0–0.1)
Immature Granulocytes: 0 %
LYMPHS: 26 %
Lymphocytes Absolute: 2.1 10*3/uL (ref 0.7–3.1)
MCH: 28.1 pg (ref 26.6–33.0)
MCHC: 32.2 g/dL (ref 31.5–35.7)
MCV: 88 fL (ref 79–97)
MONOCYTES: 7 %
Monocytes Absolute: 0.6 10*3/uL (ref 0.1–0.9)
NEUTROS ABS: 5.1 10*3/uL (ref 1.4–7.0)
Neutrophils: 64 %
Platelets: 285 10*3/uL (ref 150–379)
RBC: 4.23 x10E6/uL (ref 3.77–5.28)
RDW: 14.3 % (ref 12.3–15.4)
WBC: 8 10*3/uL (ref 3.4–10.8)

## 2016-05-29 LAB — LIPID PANEL
CHOL/HDL RATIO: 2.7 ratio (ref 0.0–4.4)
Cholesterol, Total: 154 mg/dL (ref 100–199)
HDL: 58 mg/dL (ref >39)
LDL CALC: 80 mg/dL (ref 0–99)
Triglycerides: 81 mg/dL (ref 0–149)
VLDL CHOLESTEROL CAL: 16 mg/dL (ref 5–40)

## 2016-05-29 LAB — HEMOGLOBIN A1C
Est. average glucose Bld gHb Est-mCnc: 108 mg/dL
Hgb A1c MFr Bld: 5.4 % (ref 4.8–5.6)

## 2016-05-29 LAB — TSH: TSH: 3.33 u[IU]/mL (ref 0.450–4.500)

## 2016-05-29 LAB — HIV ANTIBODY (ROUTINE TESTING W REFLEX): HIV SCREEN 4TH GENERATION: NONREACTIVE

## 2016-05-29 NOTE — Progress Notes (Signed)
Advised  ED 

## 2016-06-04 DIAGNOSIS — M7662 Achilles tendinitis, left leg: Secondary | ICD-10-CM | POA: Diagnosis not present

## 2016-06-04 DIAGNOSIS — M7661 Achilles tendinitis, right leg: Secondary | ICD-10-CM | POA: Diagnosis not present

## 2017-03-28 DIAGNOSIS — M25561 Pain in right knee: Secondary | ICD-10-CM | POA: Diagnosis not present

## 2017-03-28 DIAGNOSIS — M25562 Pain in left knee: Secondary | ICD-10-CM | POA: Diagnosis not present

## 2017-03-28 DIAGNOSIS — M170 Bilateral primary osteoarthritis of knee: Secondary | ICD-10-CM | POA: Diagnosis not present

## 2017-04-01 ENCOUNTER — Ambulatory Visit: Payer: BLUE CROSS/BLUE SHIELD | Admitting: Family Medicine

## 2017-04-01 ENCOUNTER — Encounter: Payer: Self-pay | Admitting: Family Medicine

## 2017-04-01 VITALS — BP 122/80 | HR 92 | Temp 99.1°F | Resp 16 | Wt 327.0 lb

## 2017-04-01 DIAGNOSIS — B349 Viral infection, unspecified: Secondary | ICD-10-CM | POA: Diagnosis not present

## 2017-04-01 LAB — POC INFLUENZA A&B (BINAX/QUICKVUE)
INFLUENZA A, POC: NEGATIVE
INFLUENZA B, POC: NEGATIVE

## 2017-04-01 MED ORDER — HYDROCODONE-HOMATROPINE 5-1.5 MG/5ML PO SYRP: ORAL_SOLUTION | ORAL | 0 refills | Status: DC

## 2017-04-01 NOTE — Patient Instructions (Signed)
May also try Delsym for cough.

## 2017-04-01 NOTE — Progress Notes (Signed)
Subjective:     Patient ID: Stacey Castillo, female   DOB: 01/28/1971, 47 y.o.   MRN: [id number] Chief Complaint  Patient presents with  . URI    Sore throat, fever 100.0, cough    HPI States he daughter has flu 3 weeks ago and her son has just become ill. No flu shot this season. Has taken Tylenol for her sx. Tried Nyquil/Dayquil but irritated her throat.  Review of Systems     Objective:   Physical Exam  Constitutional: She appears well-developed and well-nourished. No distress.  Ears: T.M's intact without inflammation Throat: no tonsillar enlargement or exudate Neck: left anterior cervical tenderness Lungs: clear     Assessment:    1. Acute viral syndrome - POC Influenza A&B(BINAX/QUICKVUE) - HYDROcodone-homatropine (HYCODAN) 5-1.5 MG/5ML syrup; 5 ml 4-6 hours as needed for cough  Dispense: 120 mL; Refill: 0    Plan:    Discussed use of otc medication.   Work excuse for 2/11-2/15/19.

## 2017-04-05 ENCOUNTER — Ambulatory Visit: Payer: BLUE CROSS/BLUE SHIELD | Admitting: Family Medicine

## 2017-04-05 ENCOUNTER — Encounter: Payer: Self-pay | Admitting: Family Medicine

## 2017-04-05 VITALS — BP 112/78 | HR 91 | Temp 99.3°F | Resp 16 | Wt 321.8 lb

## 2017-04-05 DIAGNOSIS — J019 Acute sinusitis, unspecified: Secondary | ICD-10-CM

## 2017-04-05 MED ORDER — DOXYCYCLINE HYCLATE 100 MG PO TABS: 100.0000 mg | ORAL_TABLET | Freq: Two times a day (BID) | ORAL | 0 refills | Status: DC

## 2017-04-05 NOTE — Progress Notes (Signed)
Subjective:     Patient ID: Stacey Castillo, female   DOB: 1970-04-25, 47 y.o.   MRN: [id number] Chief Complaint  Patient presents with  . Follow-up    Patient returns to office today for follow up visit, patient was last seen 04/01/17 for viral syndrome and prescribed Hycodan. Patient states that symptoms have not improved and that Hycodan prescription made cough worse. Patient reports pain and fullness in her ears, difficulty swallowing, headache, fever high of 99-99.7, shortness of breath and wheezing.    HPI She is one week from onset of URI sx with persistent purulent nasal drainage and productive cough.  Review of Systems     Objective:   Physical Exam  Constitutional: She appears well-developed and well-nourished. No distress ( congested cough present).  Ears: T.M's intact without inflammation Sinuses: non-tender Throat: no tonsillar enlargement or exudate Neck: no cervical adenopathy Lungs: clear     Assessment:    1. Acute sinusitis, recurrence not specified, unspecified location - doxycycline (VIBRA-TABS) 100 MG tablet; Take 1 tablet (100 mg total) by mouth 2 (two) times daily.  Dispense: 20 tablet; Refill:     Plan:    Discussed use of Mucinex and Delsym.

## 2017-04-05 NOTE — Patient Instructions (Signed)
Add Mucinex for congestion and Delsym for cough.

## 2017-06-25 ENCOUNTER — Emergency Department: Payer: BLUE CROSS/BLUE SHIELD

## 2017-06-25 ENCOUNTER — Encounter: Payer: Self-pay | Admitting: Emergency Medicine

## 2017-06-25 ENCOUNTER — Emergency Department
Admission: EM | Admit: 2017-06-25 | Discharge: 2017-06-25 | Disposition: A | Discharge status: Home / Self Care | Payer: BLUE CROSS/BLUE SHIELD | Attending: Emergency Medicine | Admitting: Emergency Medicine

## 2017-06-25 ENCOUNTER — Other Ambulatory Visit: Payer: Self-pay

## 2017-06-25 DIAGNOSIS — R079 Chest pain, unspecified: Secondary | ICD-10-CM | POA: Diagnosis not present

## 2017-06-25 DIAGNOSIS — Z79899 Other long term (current) drug therapy: Secondary | ICD-10-CM | POA: Insufficient documentation

## 2017-06-25 DIAGNOSIS — R42 Dizziness and giddiness: Secondary | ICD-10-CM | POA: Diagnosis not present

## 2017-06-25 DIAGNOSIS — R072 Precordial pain: Secondary | ICD-10-CM | POA: Diagnosis not present

## 2017-06-25 LAB — CBC
HEMATOCRIT: 35.5 % (ref 35.0–47.0)
HEMOGLOBIN: 12 g/dL (ref 12.0–16.0)
MCH: 29.1 pg (ref 26.0–34.0)
MCHC: 33.8 g/dL (ref 32.0–36.0)
MCV: 86 fL (ref 80.0–100.0)
Platelets: 292 10*3/uL (ref 150–440)
RBC: 4.13 MIL/uL (ref 3.80–5.20)
RDW: 14.8 % — ABNORMAL HIGH (ref 11.5–14.5)
WBC: 8.9 10*3/uL (ref 3.6–11.0)

## 2017-06-25 LAB — BASIC METABOLIC PANEL
ANION GAP: 8 (ref 5–15)
BUN: 13 mg/dL (ref 6–20)
CO2: 27 mmol/L (ref 22–32)
Calcium: 8.9 mg/dL (ref 8.9–10.3)
Chloride: 104 mmol/L (ref 101–111)
Creatinine, Ser: 0.8 mg/dL (ref 0.44–1.00)
GFR calc non Af Amer: >60 mL/min (ref >60)
GLUCOSE: 97 mg/dL (ref 65–99)
POTASSIUM: 4.2 mmol/L (ref 3.5–5.1)
Sodium: 139 mmol/L (ref 135–145)

## 2017-06-25 LAB — TROPONIN I: Troponin I: <0.03 ng/mL (ref <0.03)

## 2017-06-25 NOTE — Discharge Instructions (Addendum)
Return to the ER for new, worsening, or persistent severe chest pain, difficulty breathing, weakness or lightheadedness, or any other new or worsening symptoms that concern you.  You should follow-up with your primary care doctor, and we have made a referral to cardiologist as well.  Your blood pressure should be rechecked at that time, and if it is still elevated your doctors may want to start you on blood pressure medication at that time.

## 2017-06-25 NOTE — ED Triage Notes (Signed)
C/O left sided chest pain, onset 0800 t his morning. STates pain radiates up toward throat and to back.  STates pain started at work, patient had worked over night.  Pt BP checked at work, and was noted to be HTN.  States pain is currently much improved.

## 2017-06-25 NOTE — ED Notes (Signed)
First Nurse Note:  Patient to Triage Rm 3 for EKG.

## 2017-06-25 NOTE — ED Notes (Signed)
Pt states that her blood pressure was 220/140 at work earlier when she was having chest pain at work.  Pt states she has no history of high blood pressure.  Pt states that she is no longer having chest pain, but states that she is having some soreness.  Pt states that the chest pain lasted approx 30 minutes.  Pt denies any physical activity prior to episode.

## 2017-06-25 NOTE — ED Provider Notes (Signed)
[facility] Emergency Department Provider Note ____________________________________________   First MD Initiated Contact with Patient 06/25/17 1310     (approximate)  I have reviewed the triage vital signs and the nursing notes.   HISTORY  Chief Complaint Chest Pain    HPI Stacey Castillo is a 47 y.o. female with no significant past medical history who presents with chest pain, acute onset at 8 AM today, occurring while she was working (patient states that he just can was not doing anything physical), described as sharp, substernal, and severe in intensity.  It lasted for approximately 30 minutes and then has been gradually decreasing.  She had some associated lightheadedness but no shortness of breath.  At that time her blood pressure was measured and it was noted to be elevated, as high as 220/140.  She denies any prior history of this pain.  No acute leg pain or swelling.   Past Medical History:  Diagnosis Date  . Arthritis     Patient Active Problem List   Diagnosis Date Noted  . Heel spur 11/15/2015  . Plantar fasciitis 11/15/2015  . Achilles tendinitis of right lower extremity 11/15/2015  . Knee pain 03/24/2013    Past Surgical History:  Procedure Laterality Date  . CARPAL TUNNEL RELEASE Bilateral 2011  . CESAREAN SECTION    . HERNIA REPAIR  2010    Prior to Admission medications   Medication Sig Start Date End Date Taking? Authorizing Provider  doxycycline (VIBRA-TABS) 100 MG tablet Take 1 tablet (100 mg total) by mouth 2 (two) times daily. 04/05/17   Anola Gurney, PA  HYDROcodone-homatropine [facility]) 5-1.5 MG/5ML syrup 5 ml 4-6 hours as needed for cough 04/01/17   Anola Gurney, PA  ibuprofen (ADVIL,MOTRIN) 200 MG tablet Take by mouth.    [provider]    Allergies Latex and Penicillin g  Family History  Problem Relation Age of Onset  . Hypertension Mother   . Hypercholesterolemia Mother   . Heart disease Father   .  Diabetes Father   . Obesity Sister   . Obesity Brother   . Kidney disease Paternal Uncle   . Hypertension Maternal Grandmother     Social History Social History   Tobacco Use  . Smoking status: Never Smoker  . Smokeless tobacco: Never Used  Substance Use Topics  . Alcohol use: Yes    Comment: occasionally   . Drug use: No    Review of Systems  Constitutional: No fever. Eyes: No redness. ENT: No neck pain. Cardiovascular: Positive for chest pain. Respiratory: Denies shortness of breath. Gastrointestinal: No nausea, no vomiting.  Genitourinary: Negative for flank pain.  Musculoskeletal: Negative for back pain. Skin: Negative for rash. Neurological: Negative for headache.   ____________________________________________   PHYSICAL EXAM:  VITAL SIGNS: ED Triage Vitals [06/25/17 1024]  Enc Vitals Group     BP (!) 188/82     Pulse Rate 82     Resp 16     Temp 98.2 F (36.8 C)     Temp Source Oral     SpO2 98 %     Weight (!) 324 lb (147 kg)     Height  (1.676 m)     Head Circumference      Peak Flow      Pain Score 2     Pain Loc      Pain Edu?      Excl. in GC?     Constitutional: Alert and oriented. Well  appearing and in no acute distress. Eyes: Conjunctivae are normal.  Head: Atraumatic. Nose: No congestion/rhinnorhea. Mouth/Throat: Mucous membranes are moist.   Neck: Normal range of motion.  Cardiovascular: Normal rate, regular rhythm. Grossly normal heart sounds.  Good peripheral circulation. Respiratory: Normal respiratory effort.  No retractions. Lungs CTAB. Gastrointestinal: Soft and nontender. No distention.  Genitourinary: No flank tenderness. Musculoskeletal: No lower extremity edema.  Extremities warm and well perfused.  No calf or popliteal swelling or tenderness. Neurologic:  Normal speech and language. No gross focal neurologic deficits are appreciated.  Skin:  Skin is warm and dry. No rash noted. Psychiatric: Mood and affect are  normal. Speech and behavior are normal.  ____________________________________________   LABS (all labs ordered are listed, but only abnormal results are displayed)  Labs Reviewed  CBC - Abnormal; Notable for the following components:      Result Value   RDW 14.8 (*)    All other components within normal limits  BASIC METABOLIC PANEL  TROPONIN I  TROPONIN I  POC URINE PREG, ED   ____________________________________________  EKG  ED ECG REPORT I, Dionne Bucy, the attending physician, personally viewed and interpreted this ECG.  Date: 06/25/2017 EKG Time: 1020  Rate: 82 Rhythm: normal sinus rhythm QRS Axis: normal Intervals: normal ST/T Wave abnormalities: normal Narrative Interpretation: no evidence of acute ischemia  ____________________________________________  RADIOLOGY  CXR: No focal infiltrate or other acute findings  ____________________________________________   PROCEDURES  Procedure(s) performed: No  Procedures  Critical Care performed: No ____________________________________________   INITIAL IMPRESSION / ASSESSMENT AND PLAN / ED COURSE  Pertinent labs & imaging results that were available during my care of the patient were reviewed by me and considered in my medical decision making (see chart for details).  47 year old female with PMH as noted above presents with sharp, nonexertional chest pain acute onset and now mostly resolved.  No prior history of this.  Patient was hypertensive at the time, however I suspect that some of this may have been due to her acute pain and distress.  Past medical records reviewed in Epic and are noncontributory.  On exam here, the patient is very well-appearing, vital signs are normal except for hypertension, and the remainder the exam is as described above.  Overall the chest pain is atypical.  I have low suspicion for ACS given patient's age, lack of ACS risk factors, and the nature of the pain.  Her EKG is  normal.  We will obtain a second troponin after 3 hours to rule out.  I do not suspect aortic dissection or other vascular cause given the mostly resolved pain, and the patient has no clinical evidence of DVT or risk factors for DVT or PE.  Overall I suspect most likely benign cause such as GI, musculoskeletal, or nerve pain.  In terms of the blood pressure, patient states that she was seen by her PMD 6 months ago and has never had high blood pressure before.  I suspect that much of the hypertension today could be related to the pain and anxiety related to being in the hospital.  At this time there is no evidence of hypertensive emergency or indication for acute treatment.  We will plan to have the patient follow-up closely with her PMD and reassess the blood pressure at that time.    ----------------------------------------- 2:59 PM on 06/25/2017 -----------------------------------------  Repeat troponin is negative.  Patient remains pain-free.  Vital signs are stable and blood pressures been coming down.  She is  stable for discharge home at this time.  She agrees with the follow-up plan.  Return precautions given, and she expressed understanding.  ____________________________________________   FINAL CLINICAL IMPRESSION(S) / ED DIAGNOSES  Final diagnoses:  Nonspecific chest pain      NEW MEDICATIONS STARTED DURING THIS VISIT:  New Prescriptions   No medications on file     Note:  This document was prepared using Dragon voice recognition software and may include unintentional dictation errors.     Dionne Bucy, MD 06/25/17 1500

## 2017-06-27 ENCOUNTER — Encounter: Payer: Self-pay | Admitting: Family Medicine

## 2017-06-27 ENCOUNTER — Ambulatory Visit (INDEPENDENT_AMBULATORY_CARE_PROVIDER_SITE_OTHER): Payer: BLUE CROSS/BLUE SHIELD | Admitting: Family Medicine

## 2017-06-27 VITALS — BP 176/90 | HR 85 | Temp 98.6°F | Wt 328.8 lb

## 2017-06-27 DIAGNOSIS — R079 Chest pain, unspecified: Secondary | ICD-10-CM | POA: Diagnosis not present

## 2017-06-27 DIAGNOSIS — I10 Essential (primary) hypertension: Secondary | ICD-10-CM | POA: Diagnosis not present

## 2017-06-27 MED ORDER — METOPROLOL SUCCINATE ER 50 MG PO TB24
50.0000 mg | ORAL_TABLET | Freq: Every day | ORAL | 3 refills | Status: DC
Start: 2017-06-27 — End: 2018-06-18

## 2017-06-27 NOTE — Progress Notes (Signed)
Patient: Stacey Castillo Female    DOB: 1970/09/23   47 y.o.   MRN: [id number] Visit Date: 06/27/2017  Today's Provider: Dortha Kern, PA   Chief Complaint  Patient presents with  . ER Follow Up   Subjective:    HPI  Follow up ER visit  Patient was seen in ER for left side chest pain on 06/25/17. She was treated for nonspecific chest pain. Treatment for this included EKG, CXR and labs were done. No medication changes. Patient advised to follow up with PCP and Cardiologist. She reports good compliance with treatment. She reports this condition is Improved.  ------------------------------------------------------------------------------------ Past Medical History:  Diagnosis Date  . Arthritis    Patient Active Problem List   Diagnosis Date Noted  . Heel spur 11/15/2015  . Plantar fasciitis 11/15/2015  . Achilles tendinitis of right lower extremity 11/15/2015  . Knee pain 03/24/2013   Past Surgical History:  Procedure Laterality Date  . CARPAL TUNNEL RELEASE Bilateral 2011  . CESAREAN SECTION    . HERNIA REPAIR  2010   Family History  Problem Relation Age of Onset  . Hypertension Mother   . Hypercholesterolemia Mother   . Heart disease Father   . Diabetes Father   . Obesity Sister   . Obesity Brother   . Kidney disease Paternal Uncle   . Hypertension Maternal Grandmother    Allergies  Allergen Reactions  . Latex Rash, Swelling and Itching  . Penicillin G Hives and Swelling    Current Outpatient Medications:  .  ibuprofen (ADVIL,MOTRIN) 200 MG tablet, Take by mouth., Disp: , Rfl:   Review of Systems  Constitutional: Negative.   Respiratory: Negative.   Cardiovascular: Negative.    Social History   Tobacco Use  . Smoking status: Never Smoker  . Smokeless tobacco: Never Used  Substance Use Topics  . Alcohol use: Yes    Comment: occasionally    Objective:   BP (!) 176/90 (BP Location: Right Arm, Patient Position: Sitting, Cuff Size: Normal)    Pulse 85   Temp 98.6 F (37 C) (Oral)   Wt (!) 328 lb 12.8 oz (149.1 kg)   SpO2 98%   BMI 53.07 kg/m  Wt Readings from Last 3 Encounters:  06/27/17 (!) 328 lb 12.8 oz (149.1 kg)  06/25/17 (!) 324 lb (147 kg)  04/05/17 (!) 321 lb 12.8 oz (146 kg)   BP Readings from Last 3 Encounters:  06/27/17 (!) 176/90  06/25/17 (!) 177/99  04/05/17 112/78     Physical Exam  Constitutional: She is oriented to person, place, and time. She appears well-developed and well-nourished. No distress.  HENT:  Head: Normocephalic and atraumatic.  Right Ear: Hearing normal.  Left Ear: Hearing normal.  Nose: Nose normal.  Eyes: Conjunctivae and lids are normal. Right eye exhibits no discharge. Left eye exhibits no discharge. No scleral icterus.  Neck: Neck supple. No thyromegaly present.  Cardiovascular: Normal rate and regular rhythm.  Pulmonary/Chest: Effort normal and breath sounds normal. No respiratory distress. She exhibits no tenderness.  Abdominal: Soft. Bowel sounds are normal.  Neurological: She is alert and oriented to person, place, and time.  Skin: Skin is intact. No lesion and no rash noted.  Psychiatric: She has a normal mood and affect. Her speech is normal and behavior is normal. Thought content normal.      Assessment & Plan:     1. Essential hypertension BP elevation 06-25-17 at work with chest pain. Advised to  go to the ER. BP at that time was 220/140. At the ER BP was 188/82. Still high today without dyspnea, palpitations and chest discomfort abated. Will give Toprol-XL 50 mg qd and check CBC, CMP and lipid panel. Has a cardiology appointment tomorrow with Dr. Gwen Pounds. No previous BP troubles but family history positive for father and brother having hypertension. Recheck BP pending cardiology report. - CBC with Differential/Platelet - Comprehensive metabolic panel - Lipid panel - metoprolol succinate (TOPROL-XL) 50 MG 24 hr tablet; Take 1 tablet (50 mg total) by mouth daily. Take  with or immediately following a meal.  Dispense: 90 tablet; Refill: 3  2. Nonspecific chest pain Had a sudden sharp stabbing pain in the left chest that felt like it penetrated through to the left scapula area at work on 06-25-17. Felt lightheaded during the onset but no significant discomfort now.No pain to palpate. Troponin levels and EKG was normal when evaluated in the ER. BP still high. Will treat with beta blocker and should proceed to cardiology appointment as scheduled tomorrow. Check CK total and MB to rule out suspected chest wall musculoskeletal injury.  - CBC with Differential/Platelet - Comprehensive metabolic panel - Lipid panel - CK Total (and CKMB)       Dortha Kern, PA  [facility]

## 2017-06-28 DIAGNOSIS — I10 Essential (primary) hypertension: Secondary | ICD-10-CM | POA: Insufficient documentation

## 2017-06-28 DIAGNOSIS — I208 Other forms of angina pectoris: Secondary | ICD-10-CM | POA: Diagnosis not present

## 2017-06-28 DIAGNOSIS — R0789 Other chest pain: Secondary | ICD-10-CM | POA: Diagnosis not present

## 2017-06-28 DIAGNOSIS — R079 Chest pain, unspecified: Secondary | ICD-10-CM | POA: Diagnosis not present

## 2017-06-29 LAB — CBC WITH DIFFERENTIAL/PLATELET
BASOS: 1 %
Basophils Absolute: 0.1 10*3/uL (ref 0.0–0.2)
EOS (ABSOLUTE): 0.1 10*3/uL (ref 0.0–0.4)
EOS: 2 %
HEMOGLOBIN: 11.6 g/dL (ref 11.1–15.9)
Hematocrit: 34.8 % (ref 34.0–46.6)
IMMATURE GRANS (ABS): 0 10*3/uL (ref 0.0–0.1)
IMMATURE GRANULOCYTES: 0 %
LYMPHS: 23 %
Lymphocytes Absolute: 1.7 10*3/uL (ref 0.7–3.1)
MCH: 28.4 pg (ref 26.6–33.0)
MCHC: 33.3 g/dL (ref 31.5–35.7)
MCV: 85 fL (ref 79–97)
MONOCYTES: 6 %
Monocytes Absolute: 0.4 10*3/uL (ref 0.1–0.9)
NEUTROS PCT: 68 %
Neutrophils Absolute: 5 10*3/uL (ref 1.4–7.0)
PLATELETS: 273 10*3/uL (ref 150–379)
RBC: 4.09 x10E6/uL (ref 3.77–5.28)
RDW: 14.8 % (ref 12.3–15.4)
WBC: 7.4 10*3/uL (ref 3.4–10.8)

## 2017-06-29 LAB — COMPREHENSIVE METABOLIC PANEL
ALBUMIN: 4.5 g/dL (ref 3.5–5.5)
ALK PHOS: 84 IU/L (ref 39–117)
ALT: 11 IU/L (ref 0–32)
AST: 12 IU/L (ref 0–40)
Albumin/Globulin Ratio: 1.6 (ref 1.2–2.2)
BILIRUBIN TOTAL: 0.5 mg/dL (ref 0.0–1.2)
BUN / CREAT RATIO: 12 (ref 9–23)
BUN: 9 mg/dL (ref 6–24)
CHLORIDE: 100 mmol/L (ref 96–106)
CO2: 24 mmol/L (ref 20–29)
Calcium: 9 mg/dL (ref 8.7–10.2)
Creatinine, Ser: 0.78 mg/dL (ref 0.57–1.00)
GFR calc Af Amer: 104 mL/min/{1.73_m2} (ref >59)
GFR calc non Af Amer: 90 mL/min/{1.73_m2} (ref >59)
Globulin, Total: 2.9 g/dL (ref 1.5–4.5)
Glucose: 75 mg/dL (ref 65–99)
Potassium: 4.5 mmol/L (ref 3.5–5.2)
Sodium: 138 mmol/L (ref 134–144)
Total Protein: 7.4 g/dL (ref 6.0–8.5)

## 2017-06-29 LAB — LIPID PANEL
CHOLESTEROL TOTAL: 173 mg/dL (ref 100–199)
Chol/HDL Ratio: 2.6 ratio (ref 0.0–4.4)
HDL: 67 mg/dL (ref >39)
LDL Calculated: 83 mg/dL (ref 0–99)
Triglycerides: 117 mg/dL (ref 0–149)
VLDL CHOLESTEROL CAL: 23 mg/dL (ref 5–40)

## 2017-06-29 LAB — CK TOTAL AND CKMB (NOT AT ARMC)
CK MB INDEX: 2.4 ng/mL (ref 0.0–5.3)
Total CK: 77 U/L (ref 24–173)

## 2017-07-01 ENCOUNTER — Telehealth: Payer: Self-pay

## 2017-07-01 NOTE — Telephone Encounter (Signed)
Patient advised. 2 week follow up scheduled.  

## 2017-07-01 NOTE — Telephone Encounter (Signed)
-----   Message from Jodell Cipro [place], [place] sent at 06/29/2017  9:08 AM EDT ----- All blood tests normal. CPK-MB is another heart enzyme that can predict heart injury - your test is normal. Chest discomfort probably musculoskeletal pain. May use OTC NSAID of choice if any further chest discomfort. Awaiting report from cardiologist. Should recheck BP in 2 weeks.

## 2017-07-16 ENCOUNTER — Encounter: Payer: Self-pay | Admitting: Family Medicine

## 2017-07-16 ENCOUNTER — Ambulatory Visit: Payer: BLUE CROSS/BLUE SHIELD | Admitting: Family Medicine

## 2017-07-16 VITALS — BP 148/84 | HR 70 | Temp 98.7°F | Wt 321.4 lb

## 2017-07-16 DIAGNOSIS — I10 Essential (primary) hypertension: Secondary | ICD-10-CM

## 2017-07-16 DIAGNOSIS — Z6841 Body Mass Index (BMI) 40.0 and over, adult: Secondary | ICD-10-CM | POA: Diagnosis not present

## 2017-07-16 NOTE — Progress Notes (Signed)
Patient: Stacey Castillo Female    DOB: 04-Jun-1970   47 y.o.   MRN: [id number] Visit Date: 07/16/2017  Today's Provider: Dortha Kern, PA   Chief Complaint  Patient presents with  . Hypertension  . Follow-up   Subjective:    HPI    Hypertension, follow-up:  BP Readings from Last 3 Encounters:  07/16/17 (!) 148/84  06/27/17 (!) 176/90  06/25/17 (!) 177/99    She was last seen for hypertension 2 weeks ago.  BP at that visit was 176/90. Management changes since that visit include started Metoprolol 50 mg daily. Dr. Gwen Pounds started her on Chlorthalidone 25 mg tablet daily. She reports good compliance with treatment. She is not having side effects.  She is exercising lightly due to knee pain. She is adherent to low salt diet.   Outside blood pressures are being checked at times. She is experiencing none.  Patient denies chest pain, chest pressure/discomfort, claudication, dyspnea, exertional chest pressure/discomfort, fatigue, irregular heart beat, lower extremity edema, near-syncope, orthopnea, palpitations, paroxysmal nocturnal dyspnea, syncope and tachypnea.   Cardiovascular risk factors include hypertension and obesity (BMI >= 30 kg/m2).  Use of agents associated with hypertension: none.     Weight trend: stable Wt Readings from Last 3 Encounters:  07/16/17 (!) 321 lb 6.4 oz (145.8 kg)  06/27/17 (!) 328 lb 12.8 oz (149.1 kg)  06/25/17 (!) 324 lb (147 kg)    Current diet: in general, a "healthy" diet    ------------------------------------------------------------------------ Past Medical History:  Diagnosis Date  . Arthritis    Past Surgical History:  Procedure Laterality Date  . CARPAL TUNNEL RELEASE Bilateral 2011  . CESAREAN SECTION    . HERNIA REPAIR  2010   Family History  Problem Relation Age of Onset  . Hypertension Mother   . Hypercholesterolemia Mother   . Heart disease Father   . Diabetes Father   . Obesity Sister   . Obesity Brother   .  Kidney disease Paternal Uncle   . Hypertension Maternal Grandmother    Patient Active Problem List   Diagnosis Date Noted  . Benign essential HTN 06/28/2017  . Stable angina pectoris (HCC) 06/28/2017  . Heel spur 11/15/2015  . Plantar fasciitis 11/15/2015  . Achilles tendinitis of right lower extremity 11/15/2015  . Knee pain 03/24/2013   Allergies  Allergen Reactions  . Latex Rash, Swelling and Itching  . Penicillin G Hives and Swelling    Current Outpatient Medications:  .  chlorthalidone (HYGROTON) 25 MG tablet, Take 25 mg by mouth daily., Disp: , Rfl: 11 .  ibuprofen (ADVIL,MOTRIN) 200 MG tablet, Take by mouth., Disp: , Rfl:  .  metoprolol succinate (TOPROL-XL) 50 MG 24 hr tablet, Take 1 tablet (50 mg total) by mouth daily. Take with or immediately following a meal., Disp: 90 tablet, Rfl: 3  Review of Systems  Constitutional: Negative.   Respiratory: Negative.   Cardiovascular: Negative.    Social History   Tobacco Use  . Smoking status: Never Smoker  . Smokeless tobacco: Never Used  Substance Use Topics  . Alcohol use: Yes    Comment: occasionally    Objective:   BP (!) 148/84 (BP Location: Right Arm, Patient Position: Sitting, Cuff Size: Large)   Pulse 70   Temp 98.7 F (37.1 C) (Oral)   Wt (!) 321 lb 6.4 oz (145.8 kg)   SpO2 99%   BMI 51.88 kg/m   Physical Exam  Constitutional: She is oriented to person, place, and  time. She appears well-developed and well-nourished. No distress.  HENT:  Head: Normocephalic and atraumatic.  Right Ear: Hearing normal.  Left Ear: Hearing normal.  Nose: Nose normal.  Eyes: Conjunctivae and lids are normal. Right eye exhibits no discharge. Left eye exhibits no discharge. No scleral icterus.  Cardiovascular: Normal rate and regular rhythm.  Pulmonary/Chest: Effort normal. No respiratory distress.  Musculoskeletal: Normal range of motion.  Neurological: She is alert and oriented to person, place, and time.  Skin: Skin is  intact. No lesion and no rash noted.  Psychiatric: She has a normal mood and affect. Her speech is normal and behavior is normal. Thought content normal.      Assessment & Plan:     1. Benign essential HTN Tolerating the Metoprolol Succinate 50 mg qd and Chlorthalidone 25 mg qd with improvement in BP. Continue to restrict sodium, caffeine and work on weight loss. Continue present dosages and monitor BP at home. Call report of readings in 4-6 weeks.  2. Class 3 severe obesity due to excess calories with body mass index (BMI) of 50.0 to 59.9 in adult, unspecified whether serious comorbidity present (HCC) Has lost 7 lbs in the past 3 weeks with calorie reduction diet. Arthritis in knees does not allow for much exercise. Increase water intake and recheck progress in the next 3-4 months.      Dortha Kern, PA  [facility]

## 2017-08-29 DIAGNOSIS — H40003 Preglaucoma, unspecified, bilateral: Secondary | ICD-10-CM | POA: Diagnosis not present

## 2017-09-26 DIAGNOSIS — H40003 Preglaucoma, unspecified, bilateral: Secondary | ICD-10-CM | POA: Diagnosis not present

## 2018-04-30 DIAGNOSIS — I10 Essential (primary) hypertension: Secondary | ICD-10-CM | POA: Diagnosis not present

## 2018-04-30 DIAGNOSIS — J00 Acute nasopharyngitis [common cold]: Secondary | ICD-10-CM | POA: Diagnosis not present

## 2018-06-18 ENCOUNTER — Other Ambulatory Visit: Payer: Self-pay | Admitting: Family Medicine

## 2018-06-18 DIAGNOSIS — I10 Essential (primary) hypertension: Secondary | ICD-10-CM

## 2018-08-28 ENCOUNTER — Other Ambulatory Visit: Payer: Self-pay

## 2018-08-28 ENCOUNTER — Encounter: Payer: Self-pay | Admitting: Family Medicine

## 2018-08-28 ENCOUNTER — Ambulatory Visit: Payer: BC Managed Care – PPO | Admitting: Family Medicine

## 2018-08-28 VITALS — BP 138/82 | HR 92 | Temp 98.5°F | Wt 327.0 lb

## 2018-08-28 DIAGNOSIS — N926 Irregular menstruation, unspecified: Secondary | ICD-10-CM

## 2018-08-28 DIAGNOSIS — I10 Essential (primary) hypertension: Secondary | ICD-10-CM

## 2018-08-28 MED ORDER — CHLORTHALIDONE 25 MG PO TABS
25.0000 mg | ORAL_TABLET | Freq: Every day | ORAL | 3 refills | Status: DC
Start: 2018-08-28 — End: 2019-09-07

## 2018-08-28 MED ORDER — METOPROLOL SUCCINATE ER 50 MG PO TB24: 50.0000 mg | ORAL_TABLET | Freq: Every day | ORAL | 3 refills | Status: DC

## 2018-08-28 NOTE — Progress Notes (Signed)
Stacey Castillo  MRN: [id number] DOB: 1970/04/01  Subjective:  HPI   The patient is a 48 year old female who presents for follow up of blood pressure and refill of medications.  She was last seen on 06/27/18.  At that time she was instructed to continue her medications and restrict her caffeine and sodium and also to work on weight loss.   BP Readings from Last 3 Encounters:  07/16/17 (!) 148/84  06/27/17 (!) 176/90  06/25/17 (!) 177/99   Patient Active Problem List   Diagnosis Date Noted  . Benign essential HTN 06/28/2017  . Stable angina pectoris ([place]) 06/28/2017  . Heel spur 11/15/2015  . Plantar fasciitis 11/15/2015  . Achilles tendinitis of right lower extremity 11/15/2015  . Knee pain 03/24/2013   Past Medical History:  Diagnosis Date  . Arthritis    Past Surgical History:  Procedure Laterality Date  . CARPAL TUNNEL RELEASE Bilateral 2011  . CESAREAN SECTION    . HERNIA REPAIR  2010   Family History  Problem Relation Age of Onset  . Hypertension Mother   . Hypercholesterolemia Mother   . Heart disease Father   . Diabetes Father   . Obesity Sister   . Obesity Brother   . Kidney disease Paternal Uncle   . Hypertension Maternal Grandmother    Social History   Socioeconomic History  . Marital status: Married    Spouse name: Not on file  . Number of children: Not on file  . Years of education: Not on file  . Highest education level: Not on file  Occupational History  . Not on file  Social Needs  . Financial resource strain: Not on file  . Food insecurity    Worry: Not on file    Inability: Not on file  . Transportation needs    Medical: Not on file    Non-medical: Not on file  Tobacco Use  . Smoking status: Never Smoker  . Smokeless tobacco: Never Used  Substance and Sexual Activity  . Alcohol use: Yes    Comment: occasionally   . Drug use: No  . Sexual activity: Not on file  Lifestyle  . Physical activity    Days per week: Not on file    Minutes  per session: Not on file  . Stress: Not on file  Relationships  . Social Herbalist on phone: Not on file    Gets together: Not on file    Attends religious service: Not on file    Active member of club or organization: Not on file    Attends meetings of clubs or organizations: Not on file    Relationship status: Not on file  . Intimate partner violence    Fear of current or ex partner: Not on file    Emotionally abused: Not on file    Physically abused: Not on file    Forced sexual activity: Not on file  Other Topics Concern  . Not on file  Social History Narrative  . Not on file   Outpatient Encounter Medications as of 08/28/2018  Medication Sig  . chlorthalidone (HYGROTON) 25 MG tablet Take 25 mg by mouth daily.  Marland Kitchen ibuprofen (ADVIL,MOTRIN) 200 MG tablet Take by mouth.  . metoprolol succinate (TOPROL-XL) 50 MG 24 hr tablet TAKE 1 TABLET (50 MG TOTAL) BY MOUTH DAILY. TAKE WITH OR IMMEDIATELY FOLLOWING A MEAL.   No facility-administered encounter medications on file as of 08/28/2018.    Allergies  Allergen Reactions  . Latex Rash, Swelling and Itching  . Penicillin G Hives and Swelling   Review of Systems  Constitutional: Positive for fever.  HENT: Negative for sore throat.   Respiratory: Negative for cough and wheezing.   Cardiovascular: Positive for leg swelling (some at the end of the day). Negative for chest pain and palpitations.    Objective:  BP 138/82 (BP Location: Right Arm, Patient Position: Sitting, Cuff Size: Large)   Pulse 92   Temp 98.5 F (36.9 C) (Oral)   Wt (!) 327 lb (148.3 kg)   SpO2 96%   BMI 52.78 kg/m    Wt Readings from Last 3 Encounters:  08/28/18 (!) 327 lb (148.3 kg)  07/16/17 (!) 321 lb 6.4 oz (145.8 kg)  06/27/17 (!) 328 lb 12.8 oz (149.1 kg)   Physical Exam  Constitutional: She is oriented to person, place, and time and well-developed, well-nourished, and in no distress.  HENT:  Head: Normocephalic.  Eyes: Conjunctivae are  normal.  Neck: Neck supple.  Cardiovascular: Normal rate.  Pulmonary/Chest: Effort normal and breath sounds normal.  Abdominal: Soft. Bowel sounds are normal.  Musculoskeletal: Normal range of motion.  Neurological: She is alert and oriented to person, place, and time.  Skin: No rash noted.  Psychiatric: Mood, affect and judgment normal.    Assessment and Plan :   1. Benign essential HTN Well controlled BP. Continues to take Chlorthalidone 25 mg qd with Metoprolol Succinate 50 mg qd. Some nausea each day since starting the Chlorthalidone but unsure if it is part of menopause hormone fluctuations. Will check labs and continue present medications. - CBC with Differential/Platelet - Comprehensive metabolic panel - metoprolol succinate (TOPROL-XL) 50 MG 24 hr tablet; Take 1 tablet (50 mg total) by mouth daily. Take with or immediately following a meal.  Dispense: 90 tablet; Refill: 3 - chlorthalidone (HYGROTON) 25 MG tablet; Take 1 tablet (25 mg total) by mouth daily.  Dispense: 90 tablet; Refill: 3  2. Irregular menses Irregular and frequently skips menses over the past year. 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 Had BTL in 2006 and LMP 06-12-18. Suspect early menopause. Will check labs and follow up pending reports. - Estrogens, total - CBC with Differential/Platelet - Comprehensive metabolic panel

## 2018-08-31 LAB — CBC WITH DIFFERENTIAL/PLATELET
Basophils Absolute: 0.1 10*3/uL (ref 0.0–0.2)
Basos: 1 %
EOS (ABSOLUTE): 0.2 10*3/uL (ref 0.0–0.4)
Eos: 3 %
Hematocrit: 35.2 % (ref 34.0–46.6)
Hemoglobin: 11.7 g/dL (ref 11.1–15.9)
Immature Grans (Abs): 0 10*3/uL (ref 0.0–0.1)
Immature Granulocytes: 0 %
Lymphocytes Absolute: 2 10*3/uL (ref 0.7–3.1)
Lymphs: 27 %
MCH: 28.4 pg (ref 26.6–33.0)
MCHC: 33.2 g/dL (ref 31.5–35.7)
MCV: 85 fL (ref 79–97)
Monocytes Absolute: 0.6 10*3/uL (ref 0.1–0.9)
Monocytes: 8 %
Neutrophils Absolute: 4.4 10*3/uL (ref 1.4–7.0)
Neutrophils: 61 %
Platelets: 320 10*3/uL (ref 150–450)
RBC: 4.12 x10E6/uL (ref 3.77–5.28)
RDW: 14.6 % (ref 11.7–15.4)
WBC: 7.3 10*3/uL (ref 3.4–10.8)

## 2018-08-31 LAB — COMPREHENSIVE METABOLIC PANEL
ALT: 18 IU/L (ref 0–32)
AST: 17 IU/L (ref 0–40)
Albumin/Globulin Ratio: 1.3 (ref 1.2–2.2)
Albumin: 4.3 g/dL (ref 3.8–4.8)
Alkaline Phosphatase: 69 IU/L (ref 39–117)
BUN/Creatinine Ratio: 17 (ref 9–23)
BUN: 14 mg/dL (ref 6–24)
Bilirubin Total: 0.3 mg/dL (ref 0.0–1.2)
CO2: 26 mmol/L (ref 20–29)
Calcium: 9.6 mg/dL (ref 8.7–10.2)
Chloride: 96 mmol/L (ref 96–106)
Creatinine, Ser: 0.81 mg/dL (ref 0.57–1.00)
GFR calc Af Amer: 99 mL/min/{1.73_m2} (ref >59)
GFR calc non Af Amer: 86 mL/min/{1.73_m2} (ref >59)
Globulin, Total: 3.2 g/dL (ref 1.5–4.5)
Glucose: 88 mg/dL (ref 65–99)
Potassium: 3.3 mmol/L — ABNORMAL LOW (ref 3.5–5.2)
Sodium: 138 mmol/L (ref 134–144)
Total Protein: 7.5 g/dL (ref 6.0–8.5)

## 2018-08-31 LAB — ESTROGENS, TOTAL: Estrogen: 271 pg/mL

## 2018-09-01 ENCOUNTER — Other Ambulatory Visit: Payer: Self-pay

## 2018-09-01 ENCOUNTER — Telehealth: Payer: Self-pay

## 2018-09-01 DIAGNOSIS — E876 Hypokalemia: Secondary | ICD-10-CM

## 2018-09-01 MED ORDER — POTASSIUM CHLORIDE CRYS ER 20 MEQ PO TBCR
20.0000 meq | EXTENDED_RELEASE_TABLET | Freq: Every day | ORAL | 3 refills | Status: DC
Start: 2018-09-01 — End: 2019-01-06

## 2018-09-01 NOTE — Telephone Encounter (Signed)
[name] ED 

## 2018-09-01 NOTE — Telephone Encounter (Signed)
Advised and Rx sent to pharm 

## 2018-09-01 NOTE — Telephone Encounter (Signed)
-----   Message from Margo Common, [place] sent at 08/29/2018  8:04 AM EDT ----- Preliminary blood results show low potassium and need Klorcon 20 meq qd #30. Awaiting final report of hormone level. No sign of liver or kidney dysfunction, blood sugar is normal and no anemia.

## 2018-11-25 ENCOUNTER — Other Ambulatory Visit: Payer: Self-pay | Admitting: Family Medicine

## 2018-11-25 DIAGNOSIS — E876 Hypokalemia: Secondary | ICD-10-CM

## 2018-12-05 ENCOUNTER — Other Ambulatory Visit: Payer: Self-pay | Admitting: Family Medicine

## 2018-12-05 DIAGNOSIS — E876 Hypokalemia: Secondary | ICD-10-CM

## 2019-01-03 ENCOUNTER — Other Ambulatory Visit: Payer: Self-pay | Admitting: Family Medicine

## 2019-01-03 DIAGNOSIS — E876 Hypokalemia: Secondary | ICD-10-CM

## 2019-01-05 NOTE — Telephone Encounter (Signed)
Pt called and stated that she is not able to pick up a 90 day supply. Pt states that she was only able to pick up a 30 day supply on 12/05/18 and only has 2 left. Pt is not sure why she can not pick up the rest. Please advise.

## 2019-01-30 ENCOUNTER — Other Ambulatory Visit: Payer: Self-pay | Admitting: Family Medicine

## 2019-01-30 DIAGNOSIS — E876 Hypokalemia: Secondary | ICD-10-CM

## 2019-02-10 ENCOUNTER — Ambulatory Visit (INDEPENDENT_AMBULATORY_CARE_PROVIDER_SITE_OTHER): Payer: BC Managed Care – PPO | Admitting: Family Medicine

## 2019-02-10 ENCOUNTER — Encounter: Payer: Self-pay | Admitting: Family Medicine

## 2019-02-10 DIAGNOSIS — J019 Acute sinusitis, unspecified: Secondary | ICD-10-CM

## 2019-02-10 MED ORDER — DOXYCYCLINE HYCLATE 100 MG PO TABS: 100.0000 mg | ORAL_TABLET | Freq: Two times a day (BID) | ORAL | 0 refills | Status: AC

## 2019-02-10 NOTE — Progress Notes (Signed)
Patient: Stacey Castillo Female    DOB: November 23, 1970   48 y.o.   MRN: [id number] Visit Date: 02/10/2019  Today's Provider: Mila Merry, MD   Chief Complaint  Patient presents with  . URI   Subjective:    Virtual Visit via Video Note  I connected with Stacey Castillo on 02/10/19 at  3:40 PM EST by a video enabled telemedicine application and verified that I am speaking with the correct person using two identifiers.  Location: Patient: home Provider: [facility]   I discussed the limitations of evaluation and management by telemedicine and the availability of in person appointments. The patient expressed understanding and agreed to proceed.  URI  This is a new problem. Episode onset: Thursday. The problem has been gradually worsening. Maximum temperature: 99.4 last night. Associated symptoms include coughing, ear pain, headaches and a sore throat. Treatments tried: OTC cold medication  The treatment provided mild relief.  Now is all on the left side. Has had mild persistent headache and body aches. Occasionally short of breath during coughing spells. Cough productive clear and yellow sputum. Taken OTC Coricidin. Sleeps better on an incline. Had sore throat the first few days, but better now. No change in taste or smell.   Allergies  Allergen Reactions  . Latex Rash, Swelling and Itching  . Penicillin G Hives and Swelling     Current Outpatient Medications:  .  chlorthalidone (HYGROTON) 25 MG tablet, Take 1 tablet (25 mg total) by mouth daily., Disp: 90 tablet, Rfl: 3 .  ibuprofen (ADVIL,MOTRIN) 200 MG tablet, Take by mouth., Disp: , Rfl:  .  KLOR-CON M20 20 MEQ tablet, TAKE 1 TABLET BY MOUTH EVERY DAY, Disp: 30 tablet, Rfl: 1 .  metoprolol succinate (TOPROL-XL) 50 MG 24 hr tablet, Take 1 tablet (50 mg total) by mouth daily. Take with or immediately following a meal., Disp: 90 tablet, Rfl: 3  Review of Systems  Constitutional: Negative.   HENT: Positive for ear pain and sore throat.     Respiratory: Positive for cough.   Cardiovascular: Negative.   Musculoskeletal: Negative.   Neurological: Positive for headaches.    Social History   Tobacco Use  . Smoking status: Never Smoker  . Smokeless tobacco: Never Used  Substance Use Topics  . Alcohol use: Yes    Comment: occasionally       Objective:   There were no vitals taken for this visit.  Physical Exam  Awake, alert, oriented x 3. In no apparent distress     Assessment & Plan      1. Acute sinusitis, recurrence not specified, unspecified location  - doxycycline (VIBRA-TABS) 100 MG tablet; Take 1 tablet (100 mg total) by mouth 2 (two) times daily for 10 days.  Dispense: 20 tablet; Refill: 0 Call if symptoms change or if not rapidly improving.      I discussed the assessment and treatment plan with the patient. The patient was provided an opportunity to ask questions and all were answered. The patient agreed with the plan and demonstrated an understanding of the instructions.   The patient was advised to call back or seek an in-person evaluation if the symptoms worsen or if the condition fails to improve as anticipated.  I provided 9 minutes of non-face-to-face time during this encounter.  The entirety of the information documented in the History of Present Illness, Review of Systems and Physical Exam were personally obtained by me. Portions of this information were initially documented by  Idelle Jo, CMA and reviewed by me for thoroughness and accuracy.     Lelon Huh, MD  [facility]

## 2019-02-11 ENCOUNTER — Ambulatory Visit: Payer: BC Managed Care – PPO | Attending: Internal Medicine

## 2019-02-11 DIAGNOSIS — Z20828 Contact with and (suspected) exposure to other viral communicable diseases: Secondary | ICD-10-CM | POA: Diagnosis not present

## 2019-02-11 DIAGNOSIS — Z20822 Contact with and (suspected) exposure to covid-19: Secondary | ICD-10-CM

## 2019-02-12 LAB — NOVEL CORONAVIRUS, NAA: SARS-CoV-2, NAA: NOT DETECTED

## 2019-02-17 ENCOUNTER — Ambulatory Visit: Payer: BC Managed Care – PPO | Attending: Internal Medicine

## 2019-02-17 ENCOUNTER — Ambulatory Visit: Payer: Self-pay | Admitting: *Deleted

## 2019-02-17 ENCOUNTER — Telehealth: Payer: Self-pay

## 2019-02-17 DIAGNOSIS — Z20828 Contact with and (suspected) exposure to other viral communicable diseases: Secondary | ICD-10-CM | POA: Diagnosis not present

## 2019-02-17 DIAGNOSIS — Z20822 Contact with and (suspected) exposure to covid-19: Secondary | ICD-10-CM

## 2019-02-17 NOTE — Telephone Encounter (Signed)
Patient had a virtual visit with Dr. Caryn Section on 02/10/19 for sinus symptoms and was prescribed doxycycline. Please review chart and advise. KW

## 2019-02-17 NOTE — Telephone Encounter (Signed)
FYI

## 2019-02-17 NOTE — Telephone Encounter (Signed)
Patient had virtual visit with Dr Wanda Plump last week for sinus symptoms and was treated with Doxy.  She is having worsening cough (sounded tight over the phone).  She had fever until Saturday (she did take medication on Saturday but none since Then).  She tested Neg for Covid last week and has another test scheduled for later today.  She did not feel they had gotten in her nose with the swab.   [facility].  She sounded like she may need some form of Albuterol. Her call back number is [phone]

## 2019-02-17 NOTE — Telephone Encounter (Signed)
Reviewed symptoms onset on 12/18 Thursday and treated on 12/22 with doxycycline, if symptoms now severe with cough as documented needs in person evaluation.

## 2019-02-17 NOTE — Telephone Encounter (Signed)
Appointment scheduled for 02/18/2019

## 2019-02-17 NOTE — Telephone Encounter (Signed)
She should be evaluated wither in person at an urgent care or again virtual by a provider. It documents her cough as severe.

## 2019-02-17 NOTE — Telephone Encounter (Signed)
Spoke with patient on the phone and advised, she states that she has a appointment this afternoon to get tested for Covid and request a virtual appointment. Sharyn Lull is out of office tomorrow, and Adriana's schedule is full as well tomorrow. I placed patient on Jennis schedule at 3:40PM, [facility] was [facility] if symptoms worsen tonight, or if  she develops shortness of breath, patient understood. KW

## 2019-02-17 NOTE — Telephone Encounter (Signed)
Please advise if appropriate for cough syrup prescription. KW

## 2019-02-17 NOTE — Telephone Encounter (Signed)
Pt called stating she has a cough for the past week; she discussed it with Dr Courtney Heys at he rlast visit; the pt says she is urinating on herself due to her violent cough; she has taken Cor[place]idan Cold and cough with no relief; her cough is not productive; recommendations made per nurse triage protocol; she sees Dr Natale Milch. [facility]; pt transferred to [place] [facility] for scheduling;  will route to office for notification.  Reason for Disposition . [1] Continuous (nonstop) coughing interferes with work or school AND [2] no improvement using cough treatment per protocol  Answer Assessment - Initial Assessment Questions 1. ONSET: "When did the cough begin?"      7 days (virtual appt on 02/10/2019) 2. SEVERITY: "How bad is the cough today?"      severe 3. RESPIRATORY DISTRESS: "Describe your breathing."     Breathing ok; SOB and dizziness with coughing 4. FEVER: "Do you have a fever?" If so, ask: "What is your temperature, how was it measured, and when did it start?"      Temp 97.7 on 02/17/2019 5. HEMOPTYSIS: "Are you coughing up any blood?" If so ask: "How much?" (flecks, streaks, tablespoons, etc.)     no 6. TREATMENT: "What have you done so far to treat the cough?" (e.g., meds, fluids, humidifier)     Hot shower, Corcidan Cold and Cough 7. CARDIAC HISTORY: "Do you have any history of heart disease?" (e.g., heart attack, congestive heart failure)     HTN 8. LUNG HISTORY: "Do you have any history of lung disease?"  (e.g., pulmonary embolus, asthma, emphysema)    No; former smoker stopped 22 years ago 55. PE RISK FACTORS: "Do you have a history of blood clots?" (or: recent major surgery, recent prolonged travel, bedridden)     no 10. OTHER SYMPTOMS: "Do you have any other symptoms? (e.g., runny nose, wheezing, chest pain)      Back sore from coughing; left ear "stopped up"-crackles when she coughs or sneezing 11. PREGNANCY: "Is there any chance you are pregnant?" "When was your last menstrual  period?"     no 12. TRAVEL: "Have you traveled out of the country in the last month?" (e.g., travel history, exposures)       no  Protocols used: COUGH - ACUTE NON-PRODUCTIVE-A-AH

## 2019-02-18 ENCOUNTER — Ambulatory Visit (INDEPENDENT_AMBULATORY_CARE_PROVIDER_SITE_OTHER): Payer: BC Managed Care – PPO | Admitting: Physician Assistant

## 2019-02-18 ENCOUNTER — Encounter: Payer: Self-pay | Admitting: Physician Assistant

## 2019-02-18 DIAGNOSIS — J189 Pneumonia, unspecified organism: Secondary | ICD-10-CM

## 2019-02-18 MED ORDER — PREDNISONE 10 MG PO TABS: ORAL_TABLET | ORAL | 0 refills | Status: DC

## 2019-02-18 MED ORDER — AZITHROMYCIN 250 MG PO TABS: ORAL_TABLET | ORAL | 0 refills | Status: DC

## 2019-02-18 MED ORDER — BENZONATATE 200 MG PO CAPS: 200.0000 mg | ORAL_CAPSULE | Freq: Three times a day (TID) | ORAL | 0 refills | Status: DC | PRN

## 2019-02-18 NOTE — Progress Notes (Signed)
Patient: Stacey Castillo Female    DOB: [birth year]   48 y.o.   MRN: [id number] Visit Date: 02/18/2019  Today's Provider: Mar Daring, PA-C   Chief Complaint  Patient presents with  . Cough   Subjective:    Virtual Visit via Video Note  I connected with Stacey Castillo on 02/18/19 at  3:40 PM EST by a video enabled telemedicine application and verified that I am speaking with the correct person using two identifiers.  Location: Patient: Home Provider: [facility]   I discussed the limitations of evaluation and management by telemedicine and the availability of in person appointments. The patient expressed understanding and agreed to proceed.   Cough This is a new problem. Episode onset: 10 days ago. The problem has been gradually worsening. Associated symptoms include chills, ear congestion (left ear), a fever and sweats. Pertinent negatives include no chest pain or shortness of breath.  Patient was seen virtually by Dr. Caryn Section on 02/10/2019 for Sinuitis and was prescribed Doxycycline.  Was Covid-19 tested on 02/11/19 and was negative. Was retested yesterday and results still pending.   Allergies  Allergen Reactions  . Latex Rash, Swelling and Itching  . Penicillin G Hives and Swelling     Current Outpatient Medications:  .  chlorthalidone (HYGROTON) 25 MG tablet, Take 1 tablet (25 mg total) by mouth daily., Disp: 90 tablet, Rfl: 3 .  doxycycline (VIBRA-TABS) 100 MG tablet, Take 1 tablet (100 mg total) by mouth 2 (two) times daily for 10 days., Disp: 20 tablet, Rfl: 0 .  ibuprofen (ADVIL,MOTRIN) 200 MG tablet, Take by mouth., Disp: , Rfl:  .  KLOR-CON M20 20 MEQ tablet, TAKE 1 TABLET BY MOUTH EVERY DAY, Disp: 30 tablet, Rfl: 1 .  metoprolol succinate (TOPROL-XL) 50 MG 24 hr tablet, Take 1 tablet (50 mg total) by mouth daily. Take with or immediately following a meal., Disp: 90 tablet, Rfl: 3  Review of Systems  Constitutional: Positive for chills, diaphoresis, fatigue and  fever. Negative for appetite change.  HENT:       Left ear stopped up  Respiratory: Positive for cough. Negative for chest tightness and shortness of breath.   Cardiovascular: Negative for chest pain and palpitations.  Gastrointestinal: Negative for abdominal pain, nausea and vomiting.  Neurological: Negative for dizziness and weakness.    Social History   Tobacco Use  . Smoking status: Never Smoker  . Smokeless tobacco: Never Used  Substance Use Topics  . Alcohol use: Yes    Comment: occasionally       Objective:   There were no vitals taken for this visit. There were no vitals filed for this visit.There is no height or weight on file to calculate BMI.   Physical Exam Vitals reviewed.  Constitutional:      General: She is not in acute distress.    Appearance: Normal appearance. She is ill-appearing.  Pulmonary:     Effort: No respiratory distress.  Neurological:     Mental Status: She is alert.      No results found for any visits on 02/18/19.     Assessment & Plan     1. Community acquired pneumonia, unspecified laterality Worsening. Will treat with zpak, prednisone and tessalon perles. Push fluids. Rest. Call if worsening.  - azithromycin (ZITHROMAX) 250 MG tablet; Take 2 tablets PO on day one, and one tablet PO daily thereafter until completed.  Dispense: 6 tablet; Refill: 0 - predniSONE (DELTASONE) 10 MG tablet; Take  6 tabs PO on day 1&2, 5 tabs PO on day 3&4, 4 tabs PO on day 5&6, 3 tabs PO on day 7&8, 2 tabs PO on day 9&10, 1 tab PO on day 11&12.  Dispense: 42 tablet; Refill: 0 - benzonatate (TESSALON) 200 MG capsule; Take 1 capsule (200 mg total) by mouth 3 (three) times daily as needed.  Dispense: 30 capsule; Refill: 0  I discussed the assessment and treatment plan with the patient. The patient was provided an opportunity to ask questions and all were answered. The patient agreed with the plan and demonstrated an understanding of the instructions.   The  patient was advised to call back or seek an in-person evaluation if the symptoms worsen or if the condition fails to improve as anticipated.  I provided 12 minutes of non-face-to-face time during this encounter.      Margaretann Loveless, PA-C  [facility]

## 2019-02-19 LAB — NOVEL CORONAVIRUS, NAA: SARS-CoV-2, NAA: NOT DETECTED

## 2019-02-24 ENCOUNTER — Encounter: Payer: Self-pay | Admitting: Physician Assistant

## 2019-03-03 ENCOUNTER — Other Ambulatory Visit: Payer: Self-pay | Admitting: Family Medicine

## 2019-03-03 DIAGNOSIS — E876 Hypokalemia: Secondary | ICD-10-CM

## 2019-03-03 NOTE — Telephone Encounter (Signed)
Patient was advised 1 month ago that she needs to have repeat labs

## 2019-03-03 NOTE — Telephone Encounter (Signed)
Requested medication (s) are due for refill today:   Yes  Requested medication (s) are on the active medication list:   Yes  Future visit scheduled:   No     Last ordered: 01/06/2019  #30  1 refill.   Needs OV with fasting labs per note.   He has not scheduled an OV.  30 day courtesy refill has been given.      Requested Prescriptions  Pending Prescriptions Disp Refills   KLOR-CON M20 20 MEQ tablet [Pharmacy Med Name: KLOR-CON M20 TABLET] 30 tablet 1    Sig: TAKE 1 TABLET BY MOUTH EVERY DAY      Endocrinology:  Minerals - Potassium Supplementation Failed - 03/03/2019  2:30 PM      Failed - K in normal range and within 360 days    Potassium  Date Value Ref Range Status  08/28/2018 3.3 (L) 3.5 - 5.2 mmol/L Final          Passed - Cr in normal range and within 360 days    Creatinine, Ser  Date Value Ref Range Status  08/28/2018 0.81 0.57 - 1.00 mg/dL Final          Passed - Valid encounter within last 12 months    Recent Outpatient Visits           1 week ago Community acquired pneumonia, unspecified laterality   [facility], [place], PA-C   3 weeks ago Acute sinusitis, recurrence not specified, unspecified location   [facility] Malva Limes, MD   6 months ago Benign essential HTN   [facility] Chrismon, Jodell Cipro, [place]   1 year ago Benign essential HTN   [facility], Jodell Cipro, [place]   1 year ago Essential hypertension   [facility], [place], [place]

## 2019-03-05 ENCOUNTER — Other Ambulatory Visit: Payer: Self-pay

## 2019-03-05 DIAGNOSIS — I208 Other forms of angina pectoris: Secondary | ICD-10-CM

## 2019-03-05 DIAGNOSIS — E876 Hypokalemia: Secondary | ICD-10-CM

## 2019-03-05 DIAGNOSIS — I10 Essential (primary) hypertension: Secondary | ICD-10-CM

## 2019-03-05 NOTE — Telephone Encounter (Signed)
[name] Patient must have labs done to check her potassium.  Stacey Castillo noted in November that she needed to have labs.  No refill has been sent in yet.  Once she has her labs we will know if she needs to continue on the medicine.

## 2019-03-12 ENCOUNTER — Other Ambulatory Visit: Payer: Self-pay | Admitting: Family Medicine

## 2019-03-12 DIAGNOSIS — E876 Hypokalemia: Secondary | ICD-10-CM

## 2019-03-12 NOTE — Telephone Encounter (Signed)
Requested medication (s) are due for refill today: yes  Requested medication (s) are on the active medication list: yes  Last refill:  03/03/19 #30 0 refills Courtesy refill  Future visit scheduled: no  Notes to clinic:  Lab needed orders are in chart Patient has not had them done    Requested Prescriptions  Pending Prescriptions Disp Refills   KLOR-CON M20 20 MEQ tablet [Pharmacy Med Name: KLOR-CON M20 TABLET] 90 tablet 1    Sig: TAKE 1 TABLET BY MOUTH EVERY DAY      Endocrinology:  Minerals - Potassium Supplementation Failed - 03/12/2019  8:51 AM      Failed - K in normal range and within 360 days    Potassium  Date Value Ref Range Status  08/28/2018 3.3 (L) 3.5 - 5.2 mmol/L Final          Passed - Cr in normal range and within 360 days    Creatinine, Ser  Date Value Ref Range Status  08/28/2018 0.81 0.57 - 1.00 mg/dL Final          Passed - Valid encounter within last 12 months    Recent Outpatient Visits           3 weeks ago Community acquired pneumonia, unspecified laterality   [facility] [place], [place], PA-C   1 month ago Acute sinusitis, recurrence not specified, unspecified location   [facility] Malva Limes, [place]   6 months ago Benign essential HTN   [facility] Chrismon, Jodell Cipro, [place]   1 year ago Benign essential HTN   [facility], Jodell Cipro, [place]   1 year ago Essential hypertension   [facility], [place], [place]

## 2019-03-13 DIAGNOSIS — I10 Essential (primary) hypertension: Secondary | ICD-10-CM | POA: Diagnosis not present

## 2019-03-13 DIAGNOSIS — I208 Other forms of angina pectoris: Secondary | ICD-10-CM | POA: Diagnosis not present

## 2019-03-13 DIAGNOSIS — E876 Hypokalemia: Secondary | ICD-10-CM | POA: Diagnosis not present

## 2019-03-14 LAB — COMPREHENSIVE METABOLIC PANEL
ALT: 17 IU/L (ref 0–32)
AST: 18 IU/L (ref 0–40)
Albumin/Globulin Ratio: 1.4 (ref 1.2–2.2)
Albumin: 4.4 g/dL (ref 3.8–4.8)
Alkaline Phosphatase: 80 IU/L (ref 39–117)
BUN/Creatinine Ratio: 15 (ref 9–23)
BUN: 17 mg/dL (ref 6–24)
Bilirubin Total: 0.5 mg/dL (ref 0.0–1.2)
CO2: 26 mmol/L (ref 20–29)
Calcium: 9.6 mg/dL (ref 8.7–10.2)
Chloride: 99 mmol/L (ref 96–106)
Creatinine, Ser: 1.14 mg/dL — ABNORMAL HIGH (ref 0.57–1.00)
GFR calc Af Amer: 65 mL/min/{1.73_m2} (ref >59)
GFR calc non Af Amer: 57 mL/min/{1.73_m2} — ABNORMAL LOW (ref >59)
Globulin, Total: 3.1 g/dL (ref 1.5–4.5)
Glucose: 89 mg/dL (ref 65–99)
Potassium: 3.6 mmol/L (ref 3.5–5.2)
Sodium: 141 mmol/L (ref 134–144)
Total Protein: 7.5 g/dL (ref 6.0–8.5)

## 2019-03-14 LAB — CBC WITH DIFFERENTIAL/PLATELET
Basophils Absolute: 0.1 10*3/uL (ref 0.0–0.2)
Basos: 1 %
EOS (ABSOLUTE): 0.2 10*3/uL (ref 0.0–0.4)
Eos: 3 %
Hematocrit: 36.3 % (ref 34.0–46.6)
Hemoglobin: 12.1 g/dL (ref 11.1–15.9)
Immature Grans (Abs): 0 10*3/uL (ref 0.0–0.1)
Immature Granulocytes: 0 %
Lymphocytes Absolute: 2.1 10*3/uL (ref 0.7–3.1)
Lymphs: 32 %
MCH: 29.5 pg (ref 26.6–33.0)
MCHC: 33.3 g/dL (ref 31.5–35.7)
MCV: 89 fL (ref 79–97)
Monocytes Absolute: 0.5 10*3/uL (ref 0.1–0.9)
Monocytes: 7 %
Neutrophils Absolute: 3.7 10*3/uL (ref 1.4–7.0)
Neutrophils: 57 %
Platelets: 281 10*3/uL (ref 150–450)
RBC: 4.1 x10E6/uL (ref 3.77–5.28)
RDW: 13.7 % (ref 11.7–15.4)
WBC: 6.5 10*3/uL (ref 3.4–10.8)

## 2019-03-14 LAB — LIPID PANEL WITH LDL/HDL RATIO
Cholesterol, Total: 185 mg/dL (ref 100–199)
HDL: 52 mg/dL (ref >39)
LDL Chol Calc (NIH): 93 mg/dL (ref 0–99)
LDL/HDL Ratio: 1.8 ratio (ref 0.0–3.2)
Triglycerides: 238 mg/dL — ABNORMAL HIGH (ref 0–149)
VLDL Cholesterol Cal: 40 mg/dL (ref 5–40)

## 2019-03-14 LAB — TSH: TSH: 1.5 u[IU]/mL (ref 0.450–4.500)

## 2019-03-16 ENCOUNTER — Telehealth: Payer: Self-pay

## 2019-03-16 NOTE — Telephone Encounter (Signed)
-----   Message from Tamsen Roers, [place] sent at 03/16/2019  9:51 AM EST ----- Potassium back to normal. Triglycerides high but normal cholesterol. Slight increase in creatinine. Need to be on a low fat diet with increase in water intake. May stop the potassium when finished with this prescription. Recheck levels in 3-6 months or sooner as needed.

## 2019-03-16 NOTE — Telephone Encounter (Signed)
[name], ok for PEC to give results.  

## 2019-03-16 NOTE — Telephone Encounter (Signed)
Called left message to return call to receive Lab results

## 2019-03-17 ENCOUNTER — Telehealth: Payer: Self-pay | Admitting: Family Medicine

## 2019-03-17 NOTE — Telephone Encounter (Signed)
Pt given lab results per MD request. Dr. Shella Spearing instructed to finish the current Rx of potassium then can quit taking. Pt ran out of potassium last Wednesday. Pt asking if since she ran out a week ago if he means take one more Rx. K+ 3.6 on 1/22. Will route.

## 2019-03-17 NOTE — Telephone Encounter (Signed)
Advised 

## 2019-05-04 IMAGING — CR DG CHEST 2V
2 series · 2 of 2 positions shown · non-contrast
Comparison: Chest x-ray of [DATE]

CLINICAL DATA: Left-sided chest pain today radiating to the back
and throat region, hypertension

EXAM:
CHEST - 2 VIEW

[chest pa]
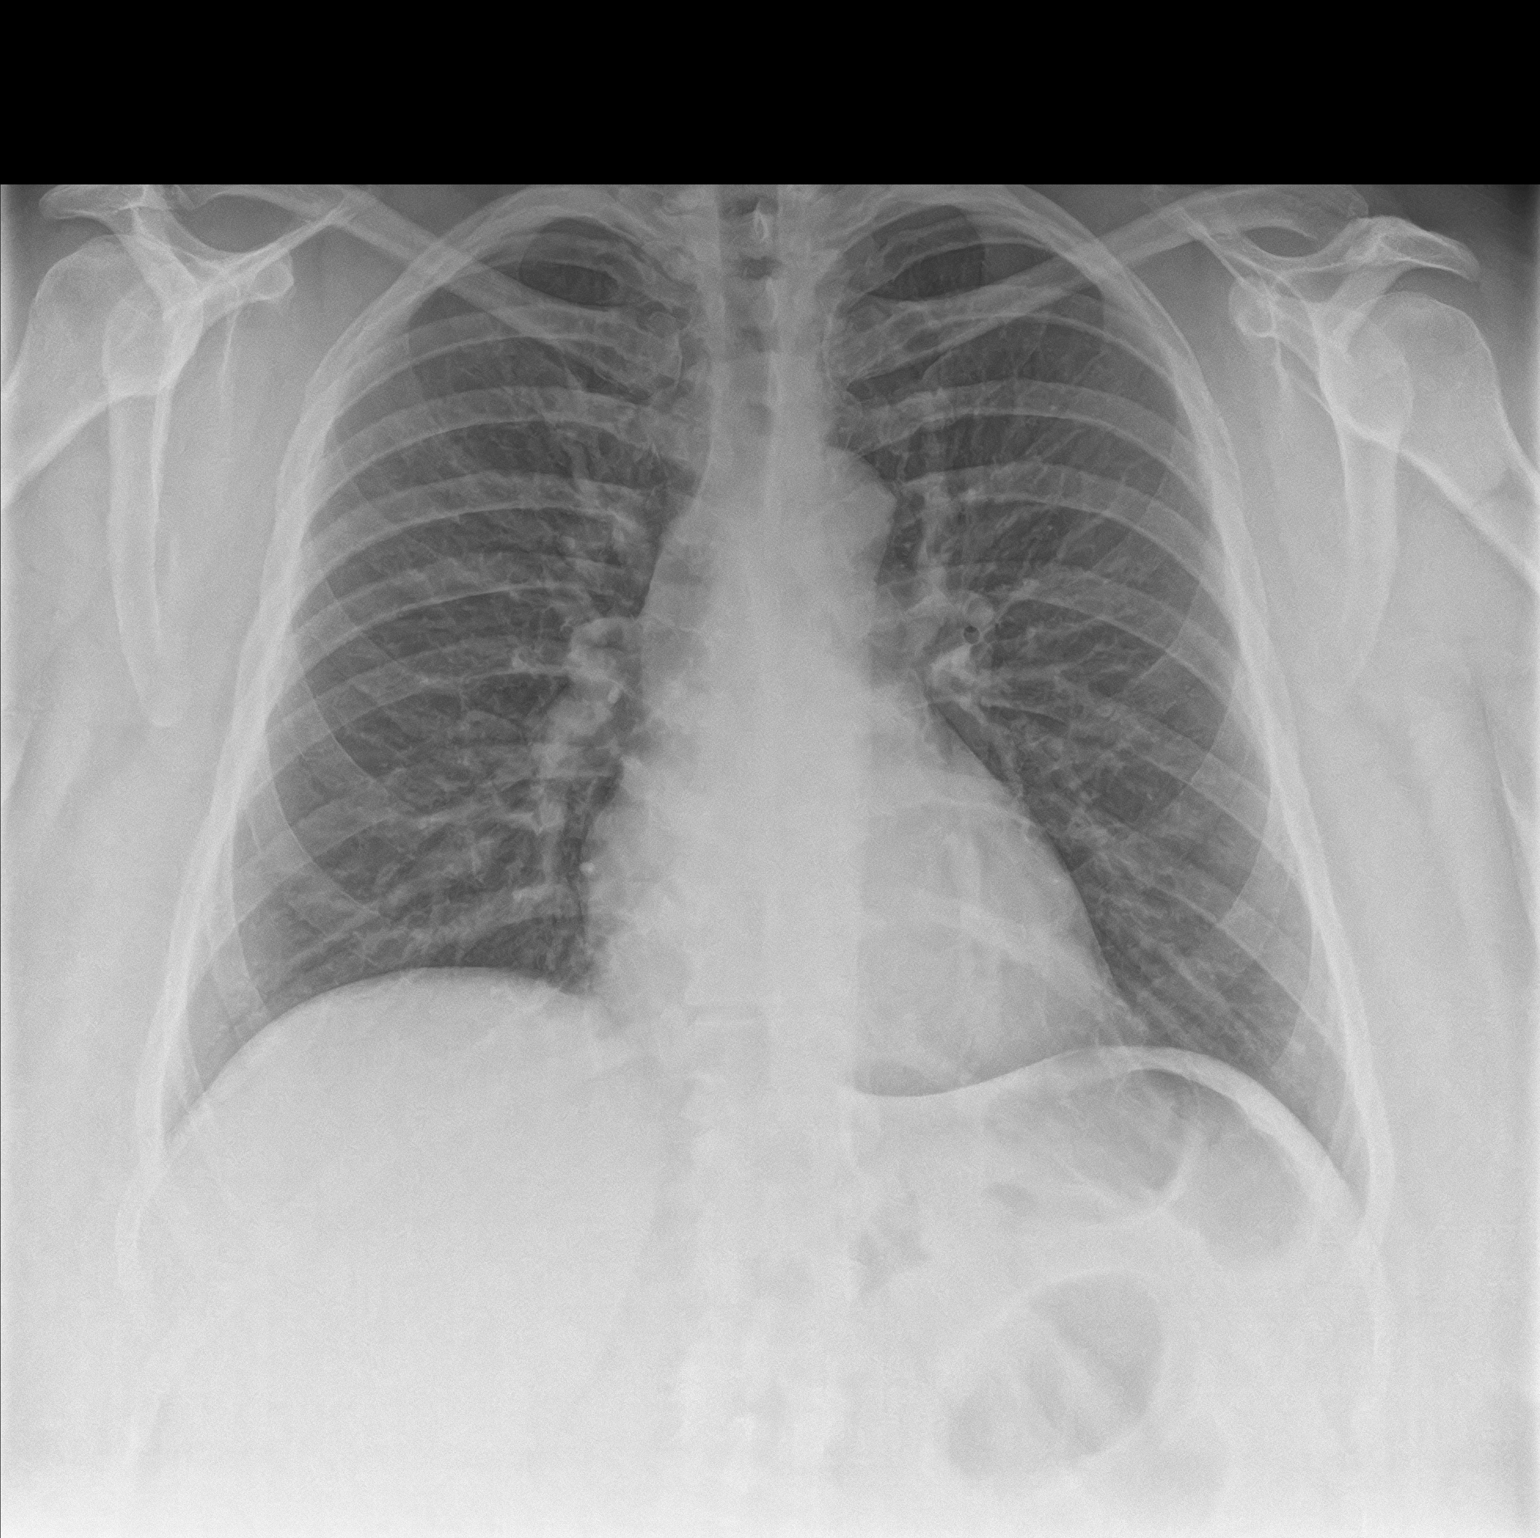

[chest lat]
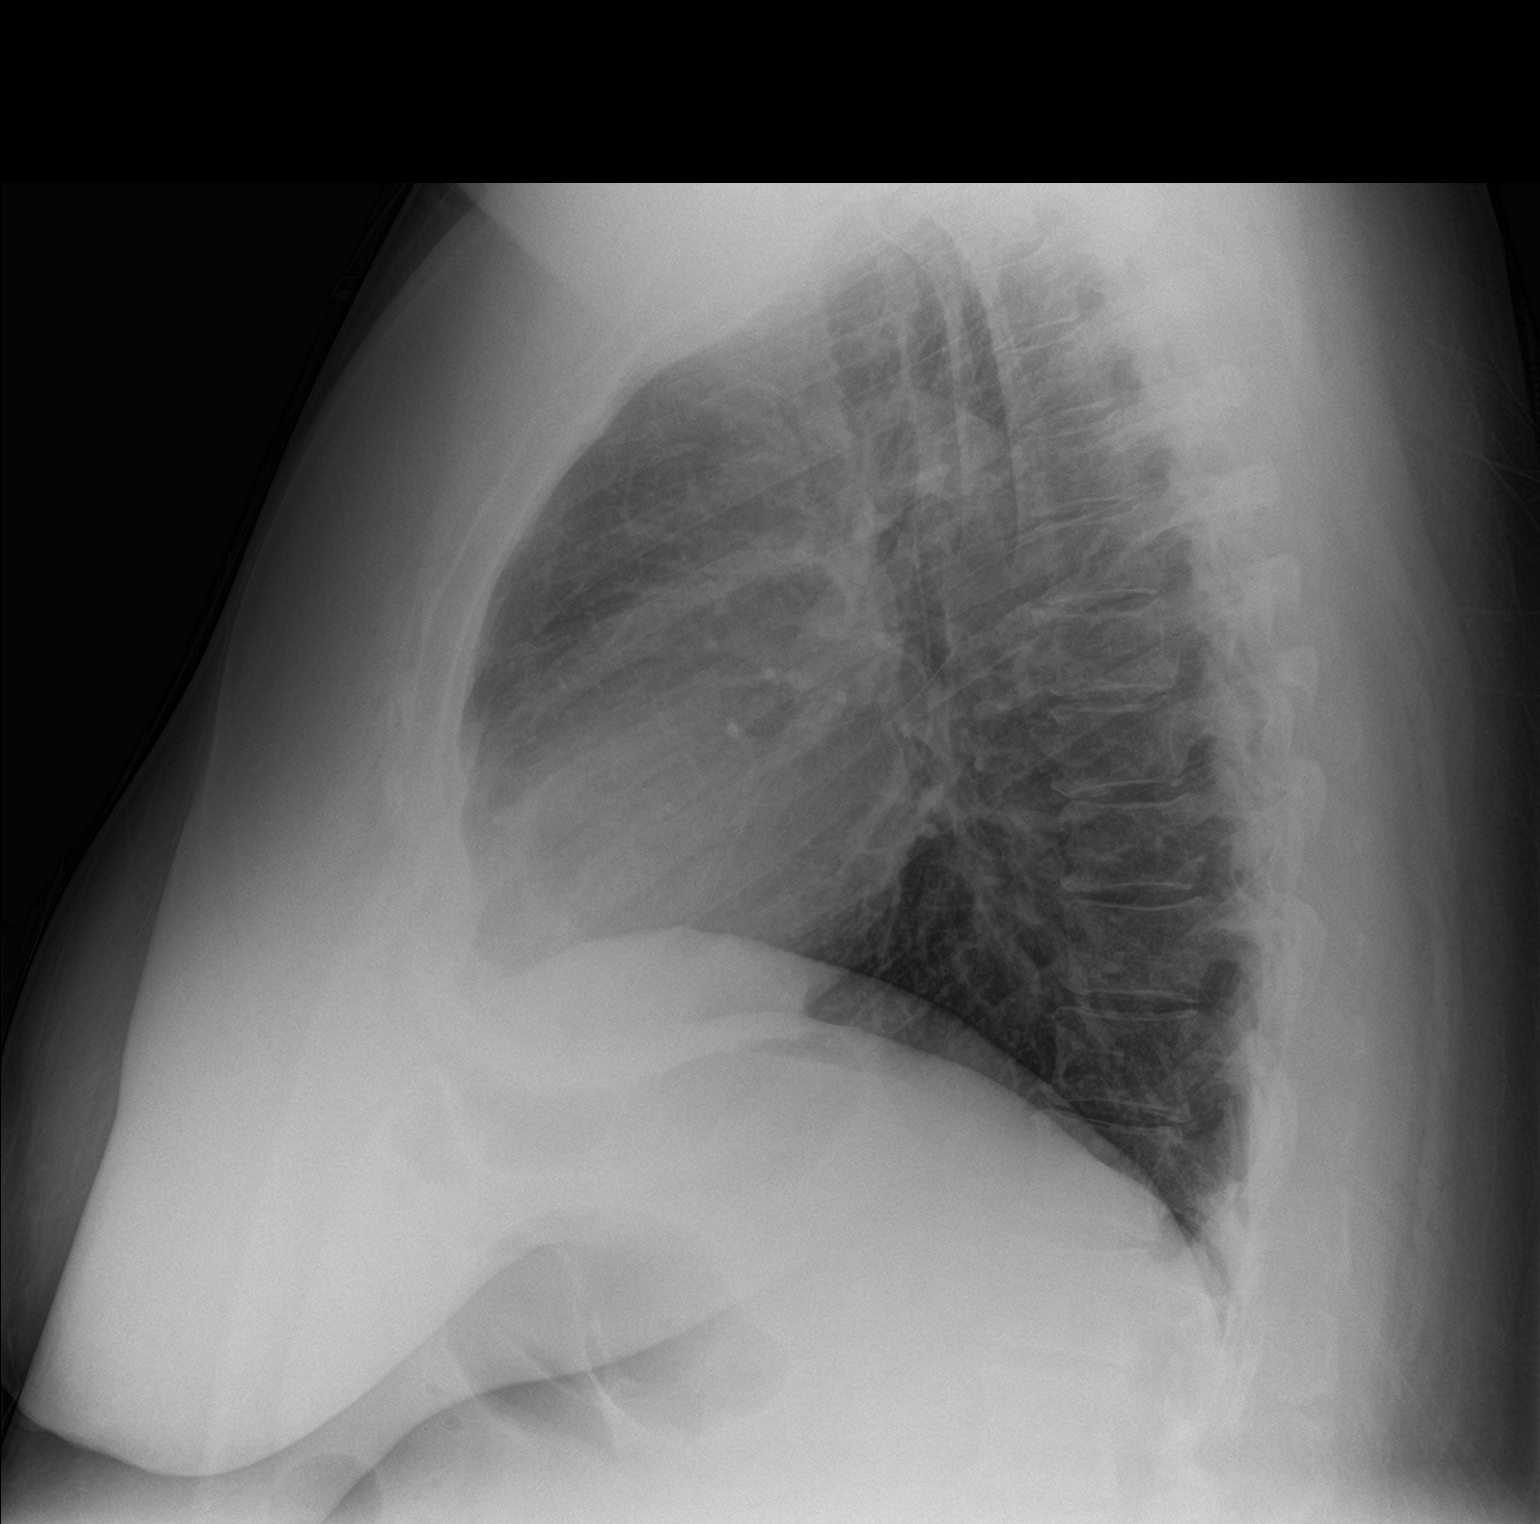

[2 of 2 positions shown; findings below may reference images not displayed]

FINDINGS: No active infiltrate or effusion is seen. Mediastinal and hilar
contours are unremarkable. No pneumothorax is noted. The heart is
within normal limits in size. No bony abnormality is seen.
IMPRESSION: No active cardiopulmonary disease.

## 2019-09-05 ENCOUNTER — Other Ambulatory Visit: Payer: Self-pay | Admitting: Family Medicine

## 2019-09-05 DIAGNOSIS — I10 Essential (primary) hypertension: Secondary | ICD-10-CM

## 2019-09-05 NOTE — Telephone Encounter (Signed)
Requested  medications are  due for refill today yes  Requested medications are on the active medication list yes  Last refill 4/8  Last visit 08/28/2018  Future visit scheduled NO  Notes to clinic Failed protocol of visit within 6 months, no upcoming appt scheduled

## 2019-09-22 ENCOUNTER — Other Ambulatory Visit: Payer: Self-pay

## 2019-09-22 ENCOUNTER — Ambulatory Visit (INDEPENDENT_AMBULATORY_CARE_PROVIDER_SITE_OTHER): Payer: BC Managed Care – PPO

## 2019-09-22 ENCOUNTER — Ambulatory Visit: Payer: BC Managed Care – PPO | Admitting: Podiatry

## 2019-09-22 ENCOUNTER — Encounter: Payer: Self-pay | Admitting: Podiatry

## 2019-09-22 DIAGNOSIS — M722 Plantar fascial fibromatosis: Secondary | ICD-10-CM | POA: Diagnosis not present

## 2019-09-22 DIAGNOSIS — M7661 Achilles tendinitis, right leg: Secondary | ICD-10-CM | POA: Diagnosis not present

## 2019-09-22 NOTE — Progress Notes (Signed)
   Subjective: 49 y.o. female presenting today for evaluation of chronic right foot and ankle pain.  Patient states that she has had an acute flareup of the heel pain and Achilles pain to the right lower extremity.  She has been dealing with this for several years now.  She also states that she needs knee replacements however she does have obesity and cannot have knee surgery until she loses weight.  She currently has an old pair of orthotics that she has been wearing and she has been icing her foot and going to physical therapy and seeing a chiropractor.   Past Medical History:  Diagnosis Date  . Arthritis      Objective: Physical Exam General: The patient is alert and oriented x3 in no acute distress.  Dermatology: Skin is warm, dry and supple bilateral lower extremities. Negative for open lesions or macerations bilateral.   Vascular: Dorsalis Pedis and Posterior Tibial pulses palpable bilateral.  Capillary fill time is immediate to all digits.  Neurological: Epicritic and protective threshold intact bilateral.   Musculoskeletal: Tenderness to palpation to the plantar aspect of the right heel along the plantar fascia.  There is also tenderness to palpation along the Achilles tendon of the right lower extremity.  All other joints range of motion within normal limits bilateral. Strength 5/5 in all groups bilateral.   Radiographic exam: Normal osseous mineralization. Joint spaces preserved. No fracture/dislocation/boney destruction. No other soft tissue abnormalities or radiopaque foreign bodies.   Assessment: 1. Plantar fasciitis right 2.  Achilles tendinitis right  Plan of Care:  1. Patient evaluated. Xrays reviewed.   2.  Steroidal anti-inflammatory injections have not helped to alleviate her symptoms in the past.  She declined injections today 3.  Patient has uncontrolled hypertension and currently cannot take any oral NSAIDs.  Patient also states that she gets polyphagia when she  is on prednisone.  No prescriptions were provided today. 4.  Appointment with Pedorthist for new custom molded orthotics 5.  Ankle brace was dispensed to wear for the right lower extremity.  Wear daily 6.  Return to clinic as needed  *Patient works third shift at [employer] on her feet 12-hour shifts  Felecia Shelling, DPM [facility]  Dr. Felecia Shelling, DPM    [address] [place], [place] [address]                Office [phone]  Fax [phone]

## 2019-10-07 ENCOUNTER — Other Ambulatory Visit: Payer: Self-pay

## 2019-10-07 ENCOUNTER — Ambulatory Visit (INDEPENDENT_AMBULATORY_CARE_PROVIDER_SITE_OTHER): Payer: BC Managed Care – PPO | Admitting: Orthotics

## 2019-10-07 DIAGNOSIS — M722 Plantar fascial fibromatosis: Secondary | ICD-10-CM

## 2019-10-07 DIAGNOSIS — M7661 Achilles tendinitis, right leg: Secondary | ICD-10-CM

## 2019-10-07 NOTE — Progress Notes (Signed)
Cast today for CMFO to address pf and at b/l; plan on device that offers RF stabilty, arch support and RF lift 1/8",  Deep heel cup.

## 2019-11-04 ENCOUNTER — Other Ambulatory Visit: Payer: Self-pay

## 2019-11-04 ENCOUNTER — Ambulatory Visit: Payer: BC Managed Care – PPO | Admitting: Orthotics

## 2019-11-04 DIAGNOSIS — M7661 Achilles tendinitis, right leg: Secondary | ICD-10-CM

## 2019-11-04 DIAGNOSIS — M722 Plantar fascial fibromatosis: Secondary | ICD-10-CM

## 2019-11-04 NOTE — Progress Notes (Signed)
Patient came in today to pick up custom made foot orthotics.  The goals were accomplished and the patient reported no dissatisfaction with said orthotics.  Patient was advised of breakin period and how to report any issues. 

## 2019-11-16 ENCOUNTER — Other Ambulatory Visit: Payer: Self-pay | Admitting: Family Medicine

## 2019-11-16 DIAGNOSIS — I10 Essential (primary) hypertension: Secondary | ICD-10-CM

## 2020-02-07 ENCOUNTER — Other Ambulatory Visit: Payer: Self-pay | Admitting: Family Medicine

## 2020-02-07 DIAGNOSIS — I10 Essential (primary) hypertension: Secondary | ICD-10-CM

## 2020-02-07 NOTE — Telephone Encounter (Signed)
Called pt and LM on VM to call office to schedule appt for further refills Requested Prescriptions  Pending Prescriptions Disp Refills   metoprolol succinate (TOPROL-XL) 50 MG 24 hr tablet [Pharmacy Med Name: METOPROLOL SUCC ER 50 MG TAB] 30 tablet 0    Sig: TAKE 1 TABLET BY MOUTH DAILY. TAKE WITH OR IMMEDIATELY FOLLOWING A MEAL     Cardiovascular:  Beta Blockers Failed - 02/07/2020  9:30 AM      Failed - Valid encounter within last 6 months    Recent Outpatient Visits          11 months ago Community acquired pneumonia, unspecified laterality   [facility] [place], [place], [place]   12 months ago Acute sinusitis, recurrence not specified, unspecified location   [facility] Malva Limes, MD   1 year ago Benign essential HTN   [facility] Chrismon, Jodell Cipro, PA-C   2 years ago Benign essential HTN   [facility] Chrismon, Jodell Cipro, PA-C   2 years ago Essential hypertension   [facility] [facility], Jodell Cipro, PA-C             Passed - Last BP in normal range    BP Readings from Last 1 Encounters:  08/28/18 138/82         Passed - Last Heart Rate in normal range    Pulse Readings from Last 1 Encounters:  08/28/18 92

## 2020-02-11 ENCOUNTER — Encounter: Payer: Self-pay | Admitting: Family Medicine

## 2020-02-11 ENCOUNTER — Ambulatory Visit: Payer: BC Managed Care – PPO | Admitting: Family Medicine

## 2020-02-11 ENCOUNTER — Other Ambulatory Visit: Payer: Self-pay

## 2020-02-11 VITALS — BP 133/69 | HR 74 | Temp 98.4°F | Resp 16 | Ht 64.5 in | Wt 332.0 lb

## 2020-02-11 DIAGNOSIS — I10 Essential (primary) hypertension: Secondary | ICD-10-CM | POA: Diagnosis not present

## 2020-02-11 MED ORDER — CHLORTHALIDONE 25 MG PO TABS: 25.0000 mg | ORAL_TABLET | Freq: Every day | ORAL | 3 refills | Status: AC

## 2020-02-11 MED ORDER — METOPROLOL SUCCINATE ER 50 MG PO TB24: ORAL_TABLET | ORAL | 3 refills | Status: DC

## 2020-02-11 NOTE — Progress Notes (Signed)
Established patient visit   Patient: Stacey Castillo   DOB: [birth year]   49 y.o. Female  MRN: [id number] Visit Date: 02/11/2020  Today's healthcare provider: Dortha Kern, PA-C   Chief Complaint  Patient presents with  . Hypertension   Subjective    HPI  Hypertension, follow-up  BP Readings from Last 3 Encounters:  02/11/20 133/69  08/28/18 138/82  07/16/17 (!) 148/84   Wt Readings from Last 3 Encounters:  02/11/20 (!) 332 lb (150.6 kg)  08/28/18 (!) 327 lb (148.3 kg)  07/16/17 (!) 321 lb 6.4 oz (145.8 kg)     She was last seen for hypertension more than 1 year ago.    Management since that visit includes continuing same medication.  She reports good compliance with treatment. She is not having side effects.  She is following a Regular diet. She is not exercising. She does not smoke.  Use of agents associated with hypertension: NSAIDS.   Outside blood pressures are checked and average 145/85. Symptoms: No chest pain No chest pressure  No palpitations No syncope  No dyspnea No orthopnea  No paroxysmal nocturnal dyspnea No lower extremity edema   Pertinent labs: Lab Results  Component Value Date   CHOL 185 03/13/2019   HDL 52 03/13/2019   LDLCALC 93 03/13/2019   TRIG 238 (H) 03/13/2019   CHOLHDL 2.6 06/28/2017   Lab Results  Component Value Date   NA 141 03/13/2019   K 3.6 03/13/2019   CREATININE 1.14 (H) 03/13/2019   GFRNONAA 57 (L) 03/13/2019   GFRAA 65 03/13/2019   GLUCOSE 89 03/13/2019     The 10-year ASCVD risk score Denman George DC Jr., et al., 2013) is: 1.7%    Past Medical History:  Diagnosis Date  . Arthritis    Past Surgical History:  Procedure Laterality Date  . CARPAL TUNNEL RELEASE Bilateral 2011  . CESAREAN SECTION    . HERNIA REPAIR  2010   Social History   Tobacco Use  . Smoking status: Never Smoker  . Smokeless tobacco: Never Used  Substance Use Topics  . Alcohol use: Yes    Comment: occasionally   . Drug use: No    Family History  Problem Relation Age of Onset  . Hypertension Mother   . Hypercholesterolemia Mother   . Heart disease Father   . Diabetes Father   . Obesity Sister   . Obesity Brother   . Kidney disease Paternal Uncle   . Hypertension Maternal Grandmother    Allergies  Allergen Reactions  . Latex Rash, Swelling and Itching  . Penicillin G Hives and Swelling       Medications: Outpatient Medications Prior to Visit  Medication Sig  . acetaminophen (TYLENOL) 500 MG tablet Take 250 mg by mouth every 6 (six) hours as needed.  . chlorthalidone (HYGROTON) 25 MG tablet TAKE 1 TABLET BY MOUTH DAILY  . ibuprofen (ADVIL,MOTRIN) 200 MG tablet Take 125 mg by mouth.  . metoprolol succinate (TOPROL-XL) 50 MG 24 hr tablet TAKE 1 TABLET BY MOUTH DAILY. TAKE WITH OR IMMEDIATELY FOLLOWING A MEAL   No facility-administered medications prior to visit.    Review of Systems  Constitutional: Negative for appetite change, chills, fatigue and fever.  Respiratory: Negative for chest tightness and shortness of breath.   Cardiovascular: Positive for leg swelling. Negative for chest pain and palpitations.  Gastrointestinal: Negative for abdominal pain, nausea and vomiting.  Neurological: Negative for dizziness and weakness.      Objective  BP 133/69 (BP Location: Right Arm, Patient Position: Sitting, Cuff Size: Large)   Pulse 74   Temp 98.4 F (36.9 C) (Oral)   Resp 16   Ht 5' 4.5" (1.638 m)   Wt (!) 332 lb (150.6 kg)   BMI 56.11 kg/m    Physical Exam Constitutional:      General: She is not in acute distress.    Appearance: She is well-developed and well-nourished.  HENT:     Head: Normocephalic and atraumatic.     Right Ear: Hearing normal.     Left Ear: Hearing normal.     Nose: Nose normal.  Eyes:     General: Lids are normal. No scleral icterus.       Right eye: No discharge.        Left eye: No discharge.     Conjunctiva/sclera: Conjunctivae normal.  Cardiovascular:      Rate and Rhythm: Normal rate and regular rhythm.     Heart sounds: Normal heart sounds.  Pulmonary:     Effort: Pulmonary effort is normal. No respiratory distress.     Breath sounds: Normal breath sounds.  Abdominal:     General: Bowel sounds are normal.  Musculoskeletal:        General: Normal range of motion.     Cervical back: Normal range of motion and neck supple.  Skin:    General: Skin is intact.     Findings: No lesion or rash.  Neurological:     Mental Status: She is alert and oriented to person, place, and time.  Psychiatric:        Mood and Affect: Mood and affect normal.        Speech: Speech normal.        Behavior: Behavior normal.        Thought Content: Thought content normal.       No results found for any visits on 02/11/20.  Assessment & Plan     1. Benign essential HTN Very good control of BP on the Metoprolol and Hygroton. Some muscle cramps. Will check routine labs and follow up pending reports. - CBC with Differential/Platelet - Comprehensive metabolic panel - Lipid panel - TSH - metoprolol succinate (TOPROL-XL) 50 MG 24 hr tablet; TAKE 1 TABLET BY MOUTH DAILY. TAKE WITH OR IMMEDIATELY FOLLOWING A MEAL  Dispense: 90 tablet; Refill: 3 - chlorthalidone (HYGROTON) 25 MG tablet; Take 1 tablet (25 mg total) by mouth daily.  Dispense: 90 tablet; Refill: 3  2. 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 Not able to exercise much with Achille's tendon problems. Check routine labs to assess metabolism. - CBC with Differential/Platelet - Comprehensive metabolic panel - Lipid panel - TSH   No follow-ups on file.      I, Kaysee Hergert, PA-C, have reviewed all documentation for this visit. The documentation on 02/11/20 for the exam, diagnosis, procedures, and orders are all accurate and complete.    Dortha Kern, PA-C  [facility] [phone] (phone) [phone] (fax)  [facility]

## 2020-02-15 DIAGNOSIS — I10 Essential (primary) hypertension: Secondary | ICD-10-CM | POA: Diagnosis not present

## 2020-02-16 LAB — CBC WITH DIFFERENTIAL/PLATELET
Basophils Absolute: 0.1 10*3/uL (ref 0.0–0.2)
Basos: 1 %
EOS (ABSOLUTE): 0.2 10*3/uL (ref 0.0–0.4)
Eos: 3 %
Hematocrit: 35.3 % (ref 34.0–46.6)
Hemoglobin: 11.8 g/dL (ref 11.1–15.9)
Immature Grans (Abs): 0 10*3/uL (ref 0.0–0.1)
Immature Granulocytes: 0 %
Lymphocytes Absolute: 1.8 10*3/uL (ref 0.7–3.1)
Lymphs: 28 %
MCH: 29.5 pg (ref 26.6–33.0)
MCHC: 33.4 g/dL (ref 31.5–35.7)
MCV: 88 fL (ref 79–97)
Monocytes Absolute: 0.5 10*3/uL (ref 0.1–0.9)
Monocytes: 7 %
Neutrophils Absolute: 3.9 10*3/uL (ref 1.4–7.0)
Neutrophils: 61 %
Platelets: 288 10*3/uL (ref 150–450)
RBC: 4 x10E6/uL (ref 3.77–5.28)
RDW: 14 % (ref 11.7–15.4)
WBC: 6.5 10*3/uL (ref 3.4–10.8)

## 2020-02-16 LAB — COMPREHENSIVE METABOLIC PANEL
ALT: 24 IU/L (ref 0–32)
AST: 23 IU/L (ref 0–40)
Albumin/Globulin Ratio: 1.4 (ref 1.2–2.2)
Albumin: 4.4 g/dL (ref 3.8–4.8)
Alkaline Phosphatase: 76 IU/L (ref 44–121)
BUN/Creatinine Ratio: 14 (ref 9–23)
BUN: 12 mg/dL (ref 6–24)
Bilirubin Total: 0.3 mg/dL (ref 0.0–1.2)
CO2: 26 mmol/L (ref 20–29)
Calcium: 9.4 mg/dL (ref 8.7–10.2)
Chloride: 97 mmol/L (ref 96–106)
Creatinine, Ser: 0.84 mg/dL (ref 0.57–1.00)
GFR calc Af Amer: 94 mL/min/{1.73_m2} (ref >59)
GFR calc non Af Amer: 81 mL/min/{1.73_m2} (ref >59)
Globulin, Total: 3.2 g/dL (ref 1.5–4.5)
Glucose: 91 mg/dL (ref 65–99)
Potassium: 3.7 mmol/L (ref 3.5–5.2)
Sodium: 140 mmol/L (ref 134–144)
Total Protein: 7.6 g/dL (ref 6.0–8.5)

## 2020-02-16 LAB — LIPID PANEL
Chol/HDL Ratio: 3.3 ratio (ref 0.0–4.4)
Cholesterol, Total: 202 mg/dL — ABNORMAL HIGH (ref 100–199)
HDL: 61 mg/dL (ref >39)
LDL Chol Calc (NIH): 106 mg/dL — ABNORMAL HIGH (ref 0–99)
Triglycerides: 207 mg/dL — ABNORMAL HIGH (ref 0–149)
VLDL Cholesterol Cal: 35 mg/dL (ref 5–40)

## 2020-02-16 LAB — TSH: TSH: 1.99 u[IU]/mL (ref 0.450–4.500)

## 2020-03-01 ENCOUNTER — Other Ambulatory Visit: Payer: BC Managed Care – PPO

## 2020-03-01 ENCOUNTER — Other Ambulatory Visit: Payer: Self-pay

## 2020-03-01 DIAGNOSIS — Z20822 Contact with and (suspected) exposure to covid-19: Secondary | ICD-10-CM

## 2020-03-02 LAB — SARS-COV-2, NAA 2 DAY TAT

## 2020-03-02 LAB — NOVEL CORONAVIRUS, NAA: SARS-CoV-2, NAA: NOT DETECTED

## 2020-03-03 ENCOUNTER — Telehealth: Payer: Self-pay

## 2020-03-03 NOTE — Telephone Encounter (Signed)
Copied from CRM [id number]. Topic: General - Inquiry >> Mar 02, 2020  4:17 PM Adrian Prince D wrote: Reason for CRM: Patient called to speak with Dr. Shella Spearing about not feeling good at all fever 101.1, congestion, body aches and chills. She did take a Covid test and awaiting results. She is unvaccinated and wanted to know if she could start on some prednisone so she doesn't get any sicker. She would like a call back. She can be reached at [phone]. Please advise.

## 2020-03-03 NOTE — Telephone Encounter (Signed)
Patient is calling again to get a response from the doctor regarding her symptoms.  Please call patient to discuss at [phone]

## 2020-03-03 NOTE — Telephone Encounter (Signed)
Daughter tested positive for COVID  03-01-20 but patient's test has been negative. She has had fever up to 101 on 02-29-20 with body aches, chills and some diarrhea. No dyspnea, cough, congestion or wheezing. Using Advil Dual Action for fever and symptoms. Was advised by [facility] Nurse of daughter's report and recommended this patient get retested Monday. Continue quarantine and COVID restrictions.

## 2020-03-08 ENCOUNTER — Other Ambulatory Visit: Payer: BC Managed Care – PPO

## 2020-03-09 ENCOUNTER — Other Ambulatory Visit: Payer: Self-pay

## 2020-03-09 ENCOUNTER — Other Ambulatory Visit: Payer: BC Managed Care – PPO

## 2020-03-09 DIAGNOSIS — Z20822 Contact with and (suspected) exposure to covid-19: Secondary | ICD-10-CM

## 2020-03-11 ENCOUNTER — Encounter: Payer: Self-pay | Admitting: Family Medicine

## 2020-03-11 LAB — NOVEL CORONAVIRUS, NAA: SARS-CoV-2, NAA: DETECTED — AB

## 2020-03-11 LAB — SARS-COV-2, NAA 2 DAY TAT

## 2020-03-11 NOTE — Telephone Encounter (Signed)
Highly-sensitive PCR tests can continue to detect the virus for up to 90 days, meaning you may test positive well beyond the period of time you need to isolate. This is why individuals who test positive can be granted a 90-day exemption from testing.  Persons who have tested persistently or recurrently positive for SARS-CoV-2 RNA have, in some cases, had their signs and symptoms of COVID-19 improve. When viral isolation in tissue culture has been attempted in such persons in [place] and the [place], live virus has not been isolated. There is no evidence to date that clinically recovered persons with persistent or recurrent detection of viral RNA have transmitted SARS-CoV-2 to others.

## 2020-03-17 ENCOUNTER — Telehealth: Payer: Self-pay

## 2020-03-17 NOTE — Telephone Encounter (Signed)
Copied from CRM [id number]. Topic: General - Other >> Mar 17, 2020 11:34 AM Lyn Hollingshead D wrote: PT need a note to return to work / positive covid test 03/11/20 / she communicate with Maurine Minister via my chart / please advise

## 2020-03-21 ENCOUNTER — Telehealth: Payer: BC Managed Care – PPO | Admitting: Family Medicine

## 2020-03-21 ENCOUNTER — Encounter: Payer: Self-pay | Admitting: Family Medicine

## 2020-03-21 DIAGNOSIS — U071 COVID-19: Secondary | ICD-10-CM | POA: Insufficient documentation

## 2020-03-21 NOTE — Progress Notes (Signed)
Choose 1 Note Type (Video or Telephone):    Virtual Visit via Video Note   This visit type was conducted due to national recommendations for restrictions regarding the COVID-19 Pandemic (e.g. social distancing) in an effort to limit this patient's exposure and mitigate transmission in our community.  Due to her co-morbid illnesses, this patient is at least at moderate risk for complications without adequate follow up.  This format is felt to be most appropriate for this patient at this time.  All issues noted in this document were discussed and addressed.  A limited physical exam was performed with this format.  Please refer to the patient's chart for her consent to telehealth for [facility].     Evaluation Performed:  Follow-up visit  Date:  03/21/2020   ID:  Stacey Castillo, DOB January 20, 1971, MRN [id number]  Patient Location: Home Provider Location: Home Office   Participants: Nurse/CMA for intake and work up; Patient and Provider for Visit and Wrap up  Method of visit: Video Location of Patient: Home Location of Provider: Home office Consent was obtain for visit over the video. Services rendered by provider: Visit was performed via visit was performed via video  A video enabled telemedicine application was used and I verified that I am speaking with the correct person using two identifiers.  PCP:  Tamsen Roers, PA-C   Chief Complaint:  Continued COVID symptoms  History of Present Illness:    Stacey Castillo is a 50 y.o. female with PMH of HTN and morbid obesity seen today for follow up of COVID symptoms, diagnosis 03/11/20.  The patient does have symptoms concerning for COVID-19 infection (fever, chills, cough, or new shortness of breath). She has not had temp >100.4. She denies cp, leg swelling, calf pain. She also endorses headaches, has not taken any meds. She endorses decreased appetite, diarrhea and fatigue. No bloody stools. She is unvaccinated. She states symptoms began 02/29/20 and  have continued. She tested positive 03/11/20. She has been out of work during this time and was set to return tomorrow, she is requesting a continued work note for 1 more week.  Past Medical, Surgical, Social History, Allergies, and Medications have been Reviewed.  Past Medical History:  Diagnosis Date  . Arthritis    Past Surgical History:  Procedure Laterality Date  . CARPAL TUNNEL RELEASE Bilateral 2011  . CESAREAN SECTION    . HERNIA REPAIR  2010     No outpatient medications have been marked as taking for the 03/21/20 encounter (Appointment) with Alric Seton, MD.     Allergies:   Latex and Penicillin g   ROS:   Please see the history of present illness.     All other systems reviewed and are negative.   Labs/Other Tests and Data Reviewed:    Recent Labs: 02/15/2020: ALT 24; BUN 12; Creatinine, Ser 0.84; Hemoglobin 11.8; Platelets 288; Potassium 3.7; Sodium 140; TSH 1.990   Wt Readings from Last 3 Encounters:  02/11/20 (!) 332 lb (150.6 kg)  08/28/18 (!) 327 lb (148.3 kg)  07/16/17 (!) 321 lb 6.4 oz (145.8 kg)     Objective:    Vital Signs:  There were no vitals taken for this visit.   GEN:  no acute distress RESPIRATORY:  normal respiratory effort CARDIOVASCULAR:  unable to assess, good color/perfusion SKIN:  no rash, lesions or ulcers. NEURO:  alert and oriented x 3, no obvious focal deficit PSYCH:  normal affect  ASSESSMENT & PLAN:    COVID 19  Diagnosed 03/11/20, continued symptoms. Overall well appearing. Will write work note x1 week. No further treatment required. Recommended conservative treatment with tylenol, ibuprofen, cough suppressants. Given strict return precautions.   Time:   Today, I have spent 15 minutes with the patient with telehealth technology discussing the above problems.     Medication Adjustments/Labs and Tests Ordered: Current medicines are reviewed at length with the patient today.  Concerns regarding medicines are outlined  above.   Tests Ordered: No orders of the defined types were placed in this encounter.   Medication Changes: No orders of the defined types were placed in this encounter.    Disposition:  Follow up  Signed, Alric Seton, MD  03/21/2020 10:29 AM

## 2020-03-21 NOTE — Telephone Encounter (Signed)
Letter sent to her MyChart account for return to work. Should be able to return to work today if she has been free of fever the past 2-3 days. Her positive test was on 03-11-20.

## 2020-04-01 ENCOUNTER — Encounter: Payer: Self-pay | Admitting: Family Medicine

## 2020-04-08 ENCOUNTER — Telehealth: Payer: Self-pay

## 2020-04-08 NOTE — Telephone Encounter (Signed)
Received attending physician statement from [facility]. Due to Maurine Minister being out of the office can another provider complete form? Does pt need an OV visit for form completion? Please advise. (Form has been placed in Brooten' box and a copy is on file with medical records.) Thanks TNP

## 2020-04-08 NOTE — Telephone Encounter (Signed)
So it would probably be best to schedule her a virtual with someone to discuss so we can make sure dates are correct. I see it is for covid. Whoever she is scheduled with for the virtual can complete the paperwork. Just schedule at patient convenience.

## 2020-04-11 NOTE — Telephone Encounter (Signed)
[name] 04/11/2020.  PEC please offer pt with an office or virtual visit.  She can see anyone that is available.   Thanks,   -Vernona Rieger

## 2020-04-27 NOTE — Telephone Encounter (Signed)
[facility] [employer] sent forms again. Forms placed in Stacey Castillo's box since pt is scheduled to see Stacey Castillo tomorrow. Thanks TNP

## 2020-04-28 ENCOUNTER — Other Ambulatory Visit: Payer: Self-pay

## 2020-04-28 ENCOUNTER — Encounter: Payer: Self-pay | Admitting: Physician Assistant

## 2020-04-28 ENCOUNTER — Ambulatory Visit: Payer: BC Managed Care – PPO | Admitting: Physician Assistant

## 2020-04-28 VITALS — BP 159/81 | HR 82 | Temp 98.0°F | Wt 321.4 lb

## 2020-04-28 DIAGNOSIS — U071 COVID-19: Secondary | ICD-10-CM

## 2020-04-28 NOTE — Patient Instructions (Signed)
Viral Respiratory Infection Test Why am I having this test? A viral respiratory infection test is done to diagnose certain viral infections of the respiratory system. The respiratory system includes the nose, throat, windpipe, and lungs. In this test, a sample of the genetic material from the back of your nose and throat, or the nasopharynx, is collected and sent to a lab for testing. The results will show whether a virus is causing your infection. It will also help your health care provider plan for your treatment. You may be given this test if:  You have symptoms of a respiratory infection, including fever, cough, or sore throat.  You are at risk for a respiratory infection because of your work or travel.  You have had contact with someone who is sick, or there are many people who are infected in your community.  It is important to find out if you are infected, even if you do not have symptoms. You do not have to prepare for this test. What is being tested? This test checks for the presence of a virus in your respiratory system. It checks a sample for the genetic material that makes up the virus (viral genetic material). Sometimes the test may also be used to find bacteria. What kind of sample is taken? A fluid sample from the back of your nose and throat is collected using a swab that is attached to a metal or plastic wire.   What happens during the test? Your health care provider will collect the sample by:  Tilting your head back.  Inserting the swab through one nostril, along the bottom of your nose, until it reaches the back of your nose (about 2 inches or 5 cm).  Gently rolling the swab to collect viral genetic material from your nose. If the swab cannot be easily passed through your nose, your health care provider may collect the sample by:  Inserting the swab through your mouth to the back of your throat.  Inserting the swab halfway inside your nose, to the middle front of the  nose. Your health care provider may also use a sterile saline solution to collect the sample. This is done by:  Using a syringe to push a small amount of saline into your nose.  Suctioning the fluid from your nose into a cup. This fluid is called a nasal wash or aspirate. For all methods used, the collected sample will be placed into a culture tube, labeled with your name, and sent to the lab for testing. How are the results reported? Your test results will be reported as either positive or negative.  Sometimes, the test results may report that a condition is present when it is not present (false-positive result). This can happen if genetic material remains from a dead virus.  Sometimes, the test results may report that a condition is not present when it is present (false-negative result). This can happen if the sample was not collected properly or if there is not enough viral genetic material for the test to detect. What do the results mean?  A positive result means that viral genetic material was found. This also means that you likely have a respiratory infection from a virus.  A negative result means that no viral genetic material was found. This also means that you likely do not have a respiratory infection from a virus. If you have a positive result, this test may identify the type of virus or bacteria that you have. The test may indicate   whether your infection is from:  Flu (influenza) viruses.  Coronaviruses.  Rhinoviruses.  Adenoviruses.  Respiratory syncytial virus (RSV).  Bacteria such as pertussis (also called whooping cough), chlamydophila, or mycoplasma. Talk with your health care provider about what your results mean. Questions to ask your health care provider Ask your health care provider, or the department that is doing the test:  When will my results be ready?  How will I get my results?  What are my treatment options?  What other tests do I need?  What are  my next steps? Summary  A viral respiratory infection test is done to diagnose certain viral infections in the respiratory system.  This test involves collecting a sample of fluid from the back of your nose and throat and testing it in a lab for the presence of the virus.  The sample is collected using a swab attached to a metal wire or plastic tube (nasopharyngeal swab test).  A positive result means that it is likely that you have a viral respiratory infection. A negative result means that it is likely that you do not have a viral respiratory infection.  Talk with your health care provider about what your results mean. This information is not intended to replace advice given to you by your health care provider. Make sure you discuss any questions you have with your health care provider. Document Revised: 12/21/2019 Document Reviewed: 12/21/2019 Elsevier Patient Education  2021 Elsevier Inc.  

## 2020-04-28 NOTE — Progress Notes (Signed)
Established patient visit   Patient: Stacey Castillo   DOB: 1970-04-06   50 y.o. Female  MRN: [id number] Visit Date: 04/28/2020  Today's healthcare provider: Trey Sailors, PA-C   Chief Complaint  Patient presents with  . Hypertension  . FMLA paperwork  I,Porsha C McClurkin,acting as a scribe for Trey Sailors, PA-C.,have documented all relevant documentation on the behalf of Trey Sailors, PA-C,as directed by  Trey Sailors, PA-C while in the presence of Trey Sailors, PA-C.  Subjective    HPI   Patient started feeling poorly on 02/28/2020. Her COVID test was negative on 03/01/2020 and then positive on 03/09/2020. She was exposed to known positive contact. COVID symptoms included fatigue, fevers, chills, nausea, diarrhea. [employer] took her out of work on until 03/17/2020. However, at 03/17/2020 she still felt very poorly and this was extended for five more days. She was still having fevers on 03/21/2020. She returned to work on 03/28/2020.   Hypertension, follow-up  BP Readings from Last 3 Encounters:  04/28/20 (!) 159/81  02/11/20 133/69  08/28/18 138/82   Wt Readings from Last 3 Encounters:  04/28/20 (!) 321 lb 6.4 oz (145.8 kg)  02/11/20 (!) 332 lb (150.6 kg)  08/28/18 (!) 327 lb (148.3 kg)     She was last seen for hypertension 2 months ago.  BP at that visit was 133/69. Management since that visit includes continue current medication.  She reports good compliance with treatment. She is not having side effects.  She is following a Regular, Low Sodium diet. She is not exercising. She does not smoke.  Use of agents associated with hypertension: none.   Outside blood pressures are not being checked. Symptoms: No chest pain No chest pressure  No palpitations No syncope  No dyspnea No orthopnea  No paroxysmal nocturnal dyspnea No lower extremity edema   Pertinent labs: Lab Results  Component Value Date   CHOL 202 (H) 02/15/2020   HDL 61 02/15/2020    LDLCALC 106 (H) 02/15/2020   TRIG 207 (H) 02/15/2020   CHOLHDL 3.3 02/15/2020   Lab Results  Component Value Date   NA 140 02/15/2020   K 3.7 02/15/2020   CREATININE 0.84 02/15/2020   GFRNONAA 81 02/15/2020   GFRAA 94 02/15/2020   GLUCOSE 91 02/15/2020     The 10-year ASCVD risk score Denman George DC Jr., et al., 2013) is: 2.5%   ---------------------------------------------------------------------------------------------------      Medications: Outpatient Medications Prior to Visit  Medication Sig  . acetaminophen (TYLENOL) 500 MG tablet Take 250 mg by mouth every 6 (six) hours as needed.  . chlorthalidone (HYGROTON) 25 MG tablet Take 1 tablet (25 mg total) by mouth daily.  Marland Kitchen ibuprofen (ADVIL,MOTRIN) 200 MG tablet Take 125 mg by mouth.  . metoprolol succinate (TOPROL-XL) 50 MG 24 hr tablet TAKE 1 TABLET BY MOUTH DAILY. TAKE WITH OR IMMEDIATELY FOLLOWING A MEAL   No facility-administered medications prior to visit.    Review of Systems  Constitutional: Negative.   Respiratory: Negative.   Cardiovascular: Negative.   Hematological: Negative.        Objective    BP (!) 159/81 (BP Location: Left Arm, Patient Position: Sitting, Cuff Size: Large)   Pulse 82   Temp 98 F (36.7 C) (Oral)   Wt (!) 321 lb 6.4 oz (145.8 kg)   SpO2 100%   BMI 54.32 kg/m     Physical Exam Constitutional:      Appearance:  Normal appearance. She is obese.  Cardiovascular:     Rate and Rhythm: Normal rate and regular rhythm.     Heart sounds: Normal heart sounds.  Pulmonary:     Effort: Pulmonary effort is normal. No respiratory distress.     Breath sounds: Normal breath sounds.  Skin:    General: Skin is warm and dry.  Neurological:     General: No focal deficit present.     Mental Status: She is alert and oriented to person, place, and time.  Psychiatric:        Mood and Affect: Mood normal.        Behavior: Behavior normal.       No results found for any visits on 04/28/20.   Assessment & Plan    1. COVID-19  Out from work on 02/29/2020 until she reutrns 03/28/2020 from COVID. Will fill out [employer] and fax with office note.   Return if symptoms worsen or fail to improve.      I, Porsha C McClurkin, CMA, have reviewed all documentation for this visit. The documentation on 04/28/20 for the exam, diagnosis, procedures, and orders are all accurate and complete.  The entirety of the information documented in the History of Present Illness, Review of Systems and Physical Exam were personally obtained by me. Portions of this information were initially documented by [facility] and reviewed by me for thoroughness and accuracy.   I spent 30 minutes dedicated to the care of this patient on the date of this encounter to include pre-visit review of records, face-to-face time with the patient discussing COVID, and post visit ordering of testing.   Maryella Shivers  [facility] [phone] (phone) [phone] (fax)  [facility]

## 2020-05-02 ENCOUNTER — Encounter: Payer: Self-pay | Admitting: Physician Assistant

## 2020-05-03 ENCOUNTER — Other Ambulatory Visit: Payer: Self-pay

## 2020-05-03 DIAGNOSIS — I10 Essential (primary) hypertension: Secondary | ICD-10-CM

## 2020-05-03 MED ORDER — METOPROLOL SUCCINATE ER 50 MG PO TB24: ORAL_TABLET | ORAL | 0 refills | Status: DC

## 2020-05-20 ENCOUNTER — Other Ambulatory Visit: Payer: Self-pay | Admitting: Physician Assistant

## 2020-05-20 DIAGNOSIS — I10 Essential (primary) hypertension: Secondary | ICD-10-CM

## 2020-07-04 ENCOUNTER — Telehealth: Payer: Self-pay | Admitting: Family Medicine

## 2020-07-04 DIAGNOSIS — I10 Essential (primary) hypertension: Secondary | ICD-10-CM

## 2020-07-04 MED ORDER — METOPROLOL SUCCINATE ER 50 MG PO TB24: ORAL_TABLET | ORAL | 0 refills | Status: DC

## 2020-07-04 NOTE — Telephone Encounter (Signed)
[facility] faxed refill request for the following medications:  metoprolol succinate (TOPROL-XL) 50 MG 24 hr tablet  Last Rx: 05/03/20 90 day supply no refills LOV: 04/28/20 Stacey Castillo Please advise. Thanks TNP

## 2020-10-24 ENCOUNTER — Other Ambulatory Visit: Payer: Self-pay | Admitting: Family Medicine

## 2020-10-24 DIAGNOSIS — I10 Essential (primary) hypertension: Secondary | ICD-10-CM

## 2020-11-28 DIAGNOSIS — Z6841 Body Mass Index (BMI) 40.0 and over, adult: Secondary | ICD-10-CM | POA: Diagnosis not present

## 2020-11-28 DIAGNOSIS — I10 Essential (primary) hypertension: Secondary | ICD-10-CM | POA: Diagnosis not present

## 2020-11-28 DIAGNOSIS — E7849 Other hyperlipidemia: Secondary | ICD-10-CM | POA: Diagnosis not present

## 2020-11-28 DIAGNOSIS — Z0000 Encounter for general adult medical examination without abnormal findings: Secondary | ICD-10-CM | POA: Diagnosis not present

## 2020-11-29 ENCOUNTER — Other Ambulatory Visit: Payer: Self-pay | Admitting: Internal Medicine

## 2020-11-29 DIAGNOSIS — E7849 Other hyperlipidemia: Secondary | ICD-10-CM | POA: Diagnosis not present

## 2020-11-29 DIAGNOSIS — Z1231 Encounter for screening mammogram for malignant neoplasm of breast: Secondary | ICD-10-CM

## 2020-11-29 DIAGNOSIS — Z0000 Encounter for general adult medical examination without abnormal findings: Secondary | ICD-10-CM | POA: Diagnosis not present

## 2020-11-29 DIAGNOSIS — Z1159 Encounter for screening for other viral diseases: Secondary | ICD-10-CM | POA: Diagnosis not present

## 2020-11-29 DIAGNOSIS — I10 Essential (primary) hypertension: Secondary | ICD-10-CM | POA: Diagnosis not present

## 2020-12-30 DIAGNOSIS — I10 Essential (primary) hypertension: Secondary | ICD-10-CM | POA: Diagnosis not present

## 2020-12-30 DIAGNOSIS — E7849 Other hyperlipidemia: Secondary | ICD-10-CM | POA: Diagnosis not present

## 2020-12-30 DIAGNOSIS — Z6841 Body Mass Index (BMI) 40.0 and over, adult: Secondary | ICD-10-CM | POA: Diagnosis not present

## 2021-01-02 ENCOUNTER — Ambulatory Visit
Admission: RE | Admit: 2021-01-02 | Discharge: 2021-01-02 | Disposition: A | Discharge status: Home / Self Care | Payer: BC Managed Care – PPO | Source: Ambulatory Visit | Attending: Internal Medicine | Admitting: Internal Medicine

## 2021-01-02 ENCOUNTER — Other Ambulatory Visit: Payer: Self-pay

## 2021-01-02 DIAGNOSIS — Z1231 Encounter for screening mammogram for malignant neoplasm of breast: Secondary | ICD-10-CM | POA: Diagnosis not present

## 2021-01-25 DIAGNOSIS — I10 Essential (primary) hypertension: Secondary | ICD-10-CM | POA: Diagnosis not present

## 2021-01-25 DIAGNOSIS — Z6841 Body Mass Index (BMI) 40.0 and over, adult: Secondary | ICD-10-CM | POA: Diagnosis not present

## 2021-01-25 DIAGNOSIS — E7849 Other hyperlipidemia: Secondary | ICD-10-CM | POA: Diagnosis not present

## 2021-02-01 DIAGNOSIS — G4733 Obstructive sleep apnea (adult) (pediatric): Secondary | ICD-10-CM | POA: Diagnosis not present

## 2022-01-05 ENCOUNTER — Other Ambulatory Visit: Payer: Self-pay

## 2022-01-05 DIAGNOSIS — Z1231 Encounter for screening mammogram for malignant neoplasm of breast: Secondary | ICD-10-CM

## 2022-02-13 ENCOUNTER — Ambulatory Visit
Admission: RE | Admit: 2022-02-13 | Discharge: 2022-02-13 | Disposition: A | Discharge status: Home / Self Care | Payer: BC Managed Care – PPO | Source: Ambulatory Visit | Attending: Internal Medicine | Admitting: Internal Medicine

## 2022-02-13 DIAGNOSIS — Z1231 Encounter for screening mammogram for malignant neoplasm of breast: Secondary | ICD-10-CM | POA: Insufficient documentation

## 2022-11-11 IMAGING — MG MM DIGITAL SCREENING BILAT W/ TOMO AND CAD
6 of 12 series · 6 of 36 positions shown · non-contrast
Comparison: Previous exam(s).

ACR Breast Density Category a: The breast tissue is almost entirely
fatty.

CLINICAL DATA: Screening.

EXAM:
DIGITAL SCREENING BILATERAL MAMMOGRAM WITH TOMOSYNTHESIS AND CAD
TECHNIQUE: Bilateral screening digital craniocaudal and mediolateral oblique
mammograms were obtained. Bilateral screening digital breast
tomosynthesis was performed. The images were evaluated with
computer-aided detection.

[L CC synth-2D]
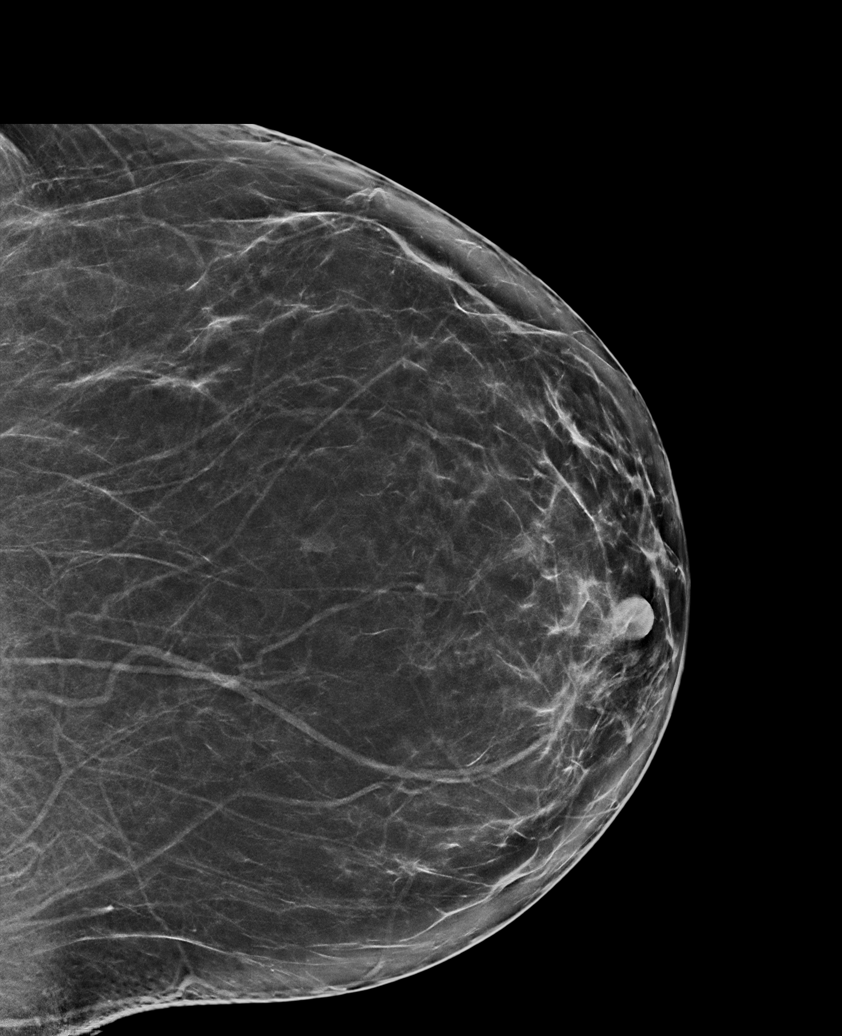

[R CV synth-2D]
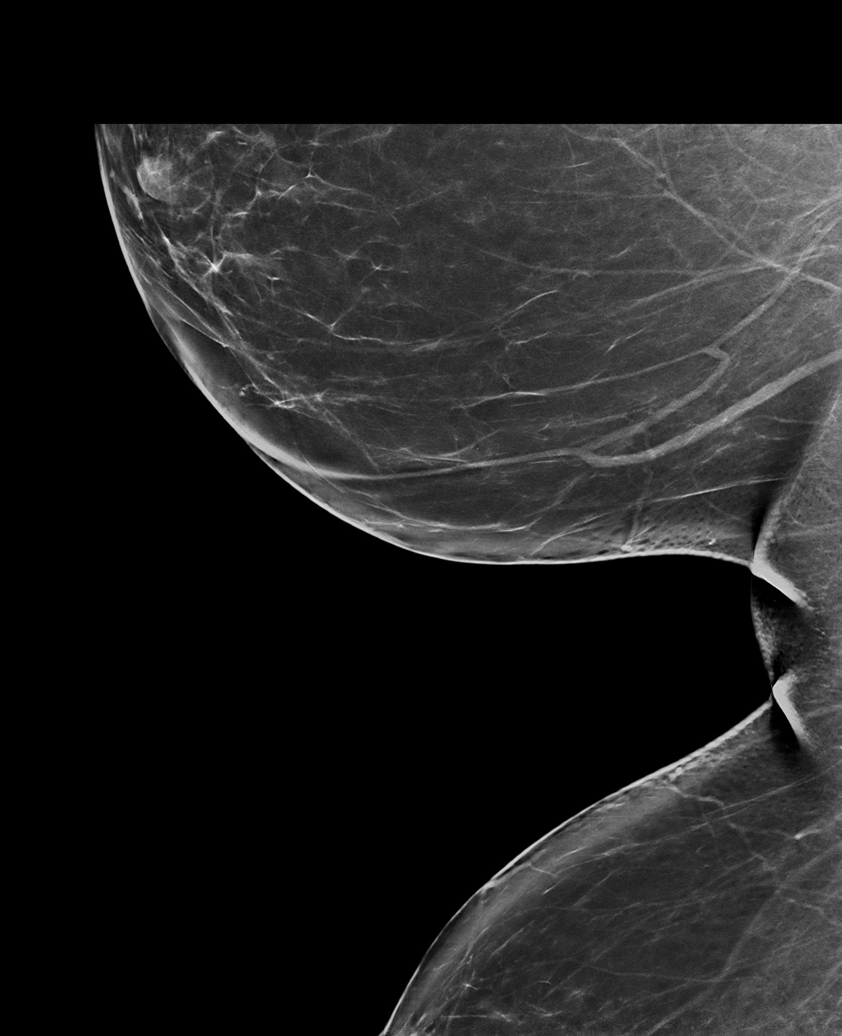

[R MLO synth-2D (1 of 2)]
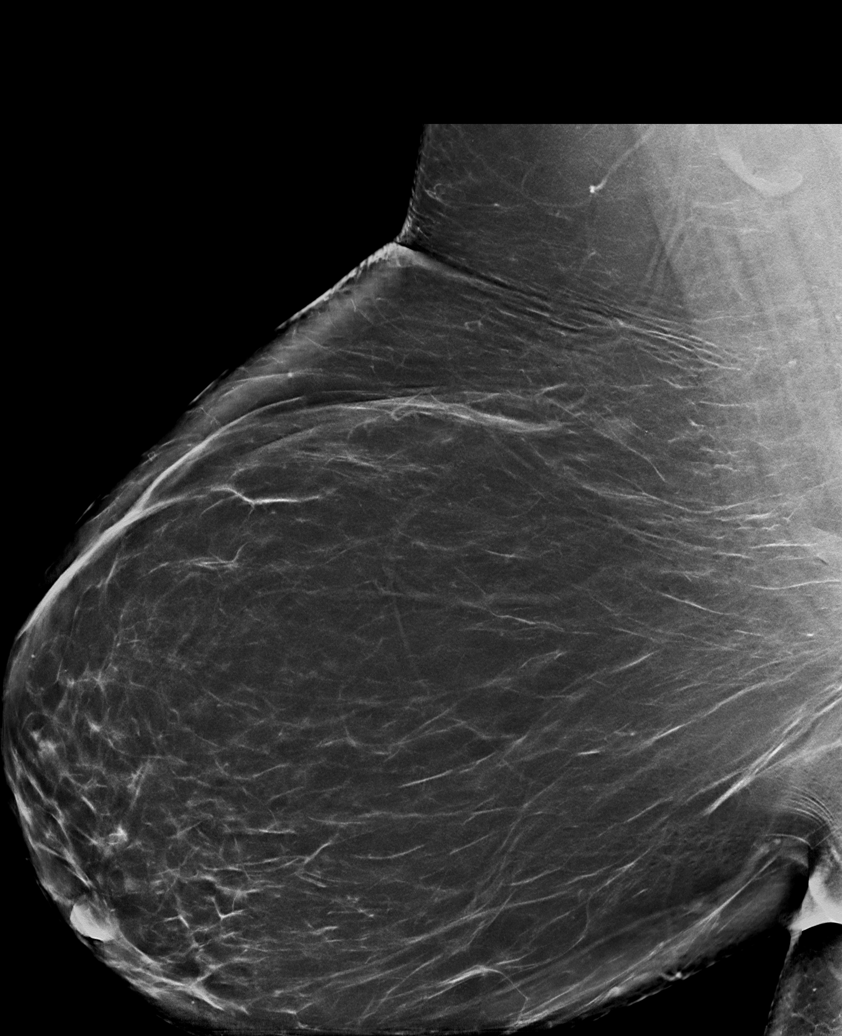

[R MLO synth-2D (2 of 2)]
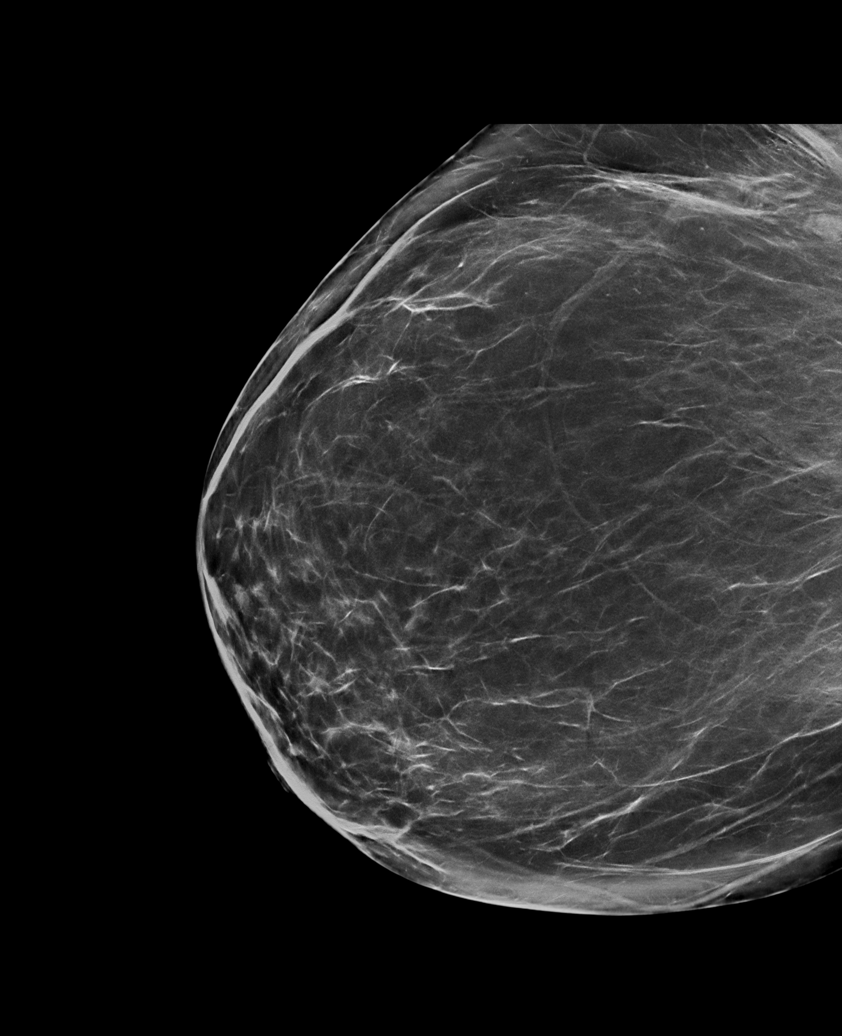

[L MLO synth-2D]
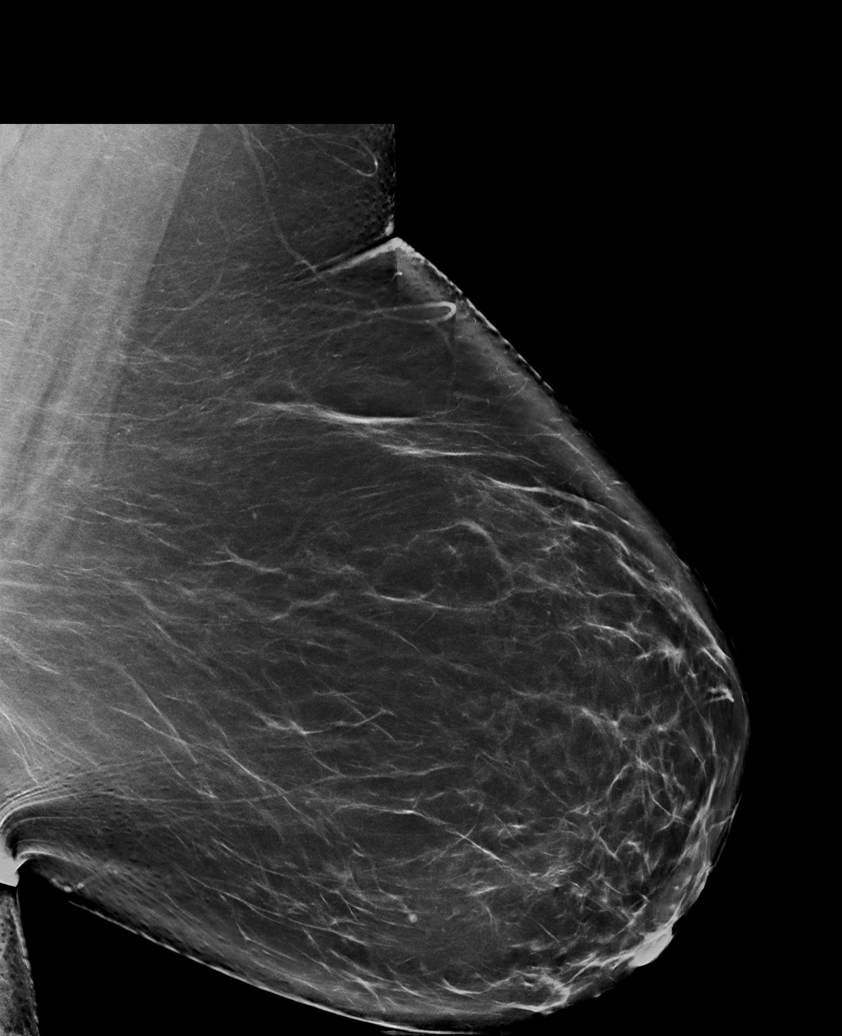

[R CC synth-2D]
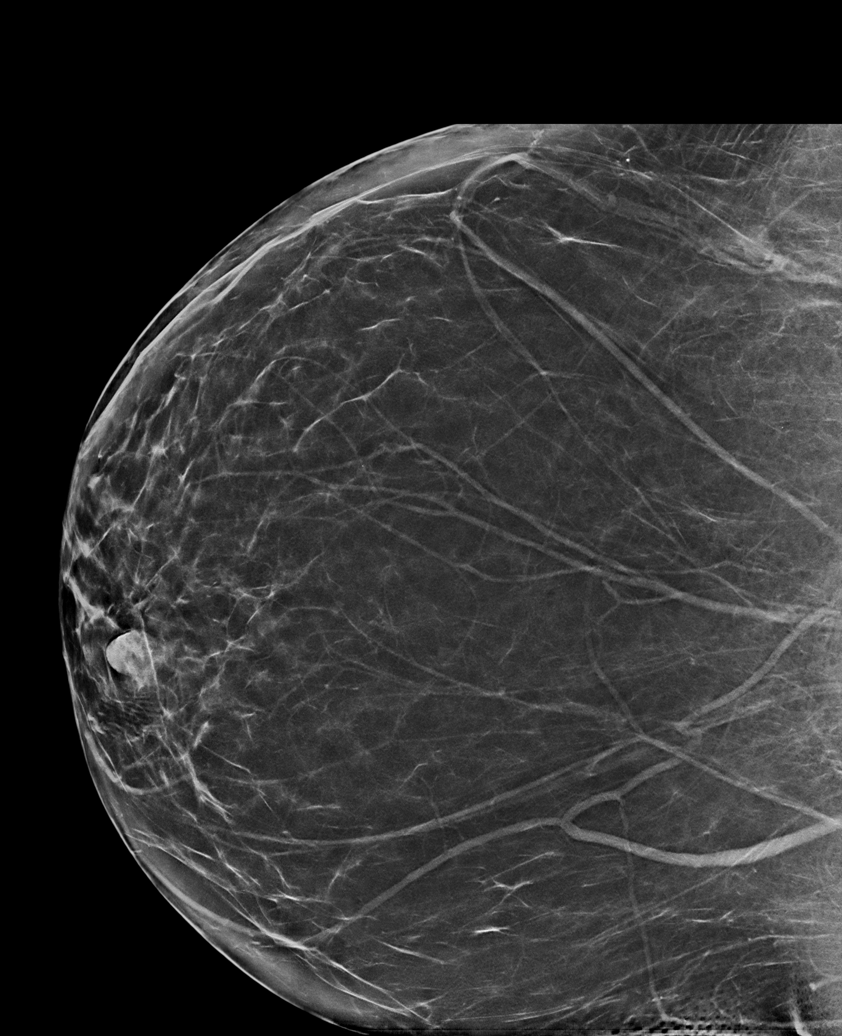

[6 of 36 positions shown; findings below may reference images not displayed]

FINDINGS: There are no findings suspicious for malignancy.
IMPRESSION: No mammographic evidence of malignancy. A result letter of this
screening mammogram will be mailed directly to the patient.

RECOMMENDATION:
Screening mammogram in one year. (Code:0E-3-N98)

BI-RADS CATEGORY  1: Negative.
